# Patient Record
Sex: Male | Born: 1960 | Race: White | Hispanic: No | Marital: Single | Smoking: Current every day smoker
Health system: Southern US, Community
[De-identification: ages and names within clinical notes are randomized; demographics above are authoritative.]

---

## 2020-12-31 ENCOUNTER — Observation Stay: Payer: Self-pay

## 2020-12-31 ENCOUNTER — Other Ambulatory Visit: Payer: Self-pay

## 2020-12-31 ENCOUNTER — Observation Stay
Admit: 2020-12-31 | Discharge: 2020-12-31 | Disposition: A | Discharge status: Home / Self Care | Payer: Self-pay | Attending: Internal Medicine | Admitting: Internal Medicine

## 2020-12-31 ENCOUNTER — Observation Stay
Admission: EM | Admit: 2020-12-31 | Discharge: 2021-01-01 | Disposition: A | Discharge status: Home / Self Care | Payer: Self-pay | Attending: Internal Medicine | Admitting: Internal Medicine

## 2020-12-31 ENCOUNTER — Emergency Department: Payer: Self-pay

## 2020-12-31 DIAGNOSIS — F17210 Nicotine dependence, cigarettes, uncomplicated: Secondary | ICD-10-CM | POA: Insufficient documentation

## 2020-12-31 DIAGNOSIS — I10 Essential (primary) hypertension: Secondary | ICD-10-CM | POA: Insufficient documentation

## 2020-12-31 DIAGNOSIS — Z79899 Other long term (current) drug therapy: Secondary | ICD-10-CM | POA: Insufficient documentation

## 2020-12-31 DIAGNOSIS — G459 Transient cerebral ischemic attack, unspecified: Principal | ICD-10-CM | POA: Insufficient documentation

## 2020-12-31 DIAGNOSIS — R0789 Other chest pain: Secondary | ICD-10-CM | POA: Insufficient documentation

## 2020-12-31 DIAGNOSIS — Z8673 Personal history of transient ischemic attack (TIA), and cerebral infarction without residual deficits: Secondary | ICD-10-CM | POA: Insufficient documentation

## 2020-12-31 DIAGNOSIS — Z20822 Contact with and (suspected) exposure to covid-19: Secondary | ICD-10-CM | POA: Insufficient documentation

## 2020-12-31 LAB — COMPREHENSIVE METABOLIC PANEL
ALT: 19 U/L (ref 0–44)
AST: 20 U/L (ref 15–41)
Albumin: 3.9 g/dL (ref 3.5–5.0)
Alkaline Phosphatase: 50 U/L (ref 38–126)
Anion gap: 9 (ref 5–15)
BUN: 18 mg/dL (ref 6–20)
CO2: 25 mmol/L (ref 22–32)
Calcium: 9.1 mg/dL (ref 8.9–10.3)
Chloride: 102 mmol/L (ref 98–111)
Creatinine, Ser: 1.07 mg/dL (ref 0.61–1.24)
GFR, Estimated: >60 mL/min (ref >60)
Glucose, Bld: 109 mg/dL — ABNORMAL HIGH (ref 70–99)
Potassium: 3.9 mmol/L (ref 3.5–5.1)
Sodium: 136 mmol/L (ref 135–145)
Total Bilirubin: 0.4 mg/dL (ref 0.3–1.2)
Total Protein: 7.3 g/dL (ref 6.5–8.1)

## 2020-12-31 LAB — CBC
HCT: 43.4 % (ref 39.0–52.0)
Hemoglobin: 14.9 g/dL (ref 13.0–17.0)
MCH: 31.6 pg (ref 26.0–34.0)
MCHC: 34.3 g/dL (ref 30.0–36.0)
MCV: 91.9 fL (ref 80.0–100.0)
Platelets: 310 10*3/uL (ref 150–400)
RBC: 4.72 MIL/uL (ref 4.22–5.81)
RDW: 12.8 % (ref 11.5–15.5)
WBC: 12.2 10*3/uL — ABNORMAL HIGH (ref 4.0–10.5)
nRBC: 0 % (ref 0.0–0.2)

## 2020-12-31 LAB — APTT: aPTT: 25 seconds (ref 24–36)

## 2020-12-31 LAB — DIFFERENTIAL
Abs Immature Granulocytes: 0.05 10*3/uL (ref 0.00–0.07)
Basophils Absolute: 0.1 10*3/uL (ref 0.0–0.1)
Basophils Relative: 1 %
Eosinophils Absolute: 0.5 10*3/uL (ref 0.0–0.5)
Eosinophils Relative: 4 %
Immature Granulocytes: 0 %
Lymphocytes Relative: 22 %
Lymphs Abs: 2.6 10*3/uL (ref 0.7–4.0)
Monocytes Absolute: 0.9 10*3/uL (ref 0.1–1.0)
Monocytes Relative: 8 %
Neutro Abs: 8 10*3/uL — ABNORMAL HIGH (ref 1.7–7.7)
Neutrophils Relative %: 65 %

## 2020-12-31 LAB — RESP PANEL BY RT-PCR (FLU A&B, COVID) ARPGX2
Influenza A by PCR: NEGATIVE
Influenza B by PCR: NEGATIVE
SARS Coronavirus 2 by RT PCR: NEGATIVE

## 2020-12-31 LAB — PROTIME-INR
INR: 1 (ref 0.8–1.2)
Prothrombin Time: 12.8 seconds (ref 11.4–15.2)

## 2020-12-31 LAB — CBG MONITORING, ED: Glucose-Capillary: 109 mg/dL — ABNORMAL HIGH (ref 70–99)

## 2020-12-31 LAB — TROPONIN I (HIGH SENSITIVITY)
Troponin I (High Sensitivity): 6 ng/L (ref <18)
Troponin I (High Sensitivity): 6 ng/L (ref <18)

## 2020-12-31 IMAGING — MR MR MRA NECK WO/W CM
4 of 5 series · 35 of 48 positions shown · IV contrast (Contrast agent)
Comparison: MRI brain same day

CLINICAL DATA: Carotid artery dissection suspected. Left-sided
numbness.

EXAM:
MRA NECK WITHOUT AND WITH CONTRAST
MRA HEAD WITHOUT CONTRAST
TECHNIQUE: Multiplanar and multiecho pulse sequences of the neck were obtained
without and with intravenous contrast. Angiographic images of the
neck were obtained using MRA technique without and with intravenous
contrast; Angiographic images of the Circle of Willis were obtained
using MRA technique without intravenous contrast.
CONTRAST:  7mL GADAVIST GADOBUTROL 1 MMOL/ML IV SOLN

[Series 19: angio_fl3d_cor_pre_ttc=2.0s · coronal · 0.9mm · 0.85mm/px · 9 of 96 slices shown]
[im 1/96]
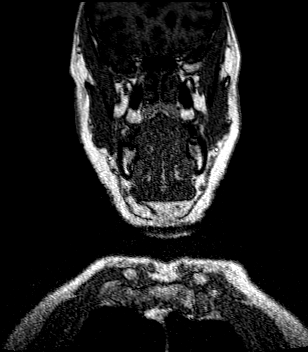
[im 12/96]
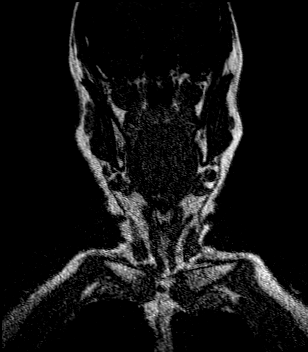
[im 24/96]
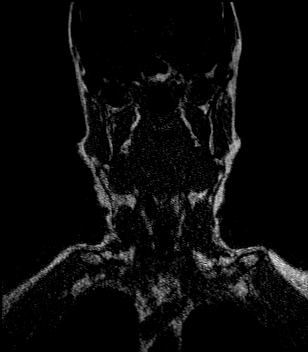
[im 36/96]
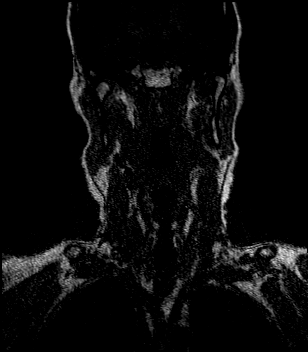
[im 48/96]
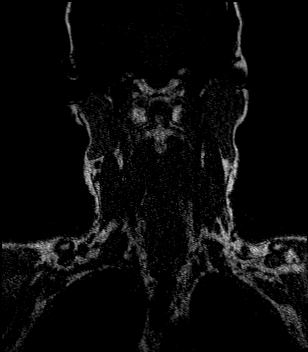
[im 60/96]
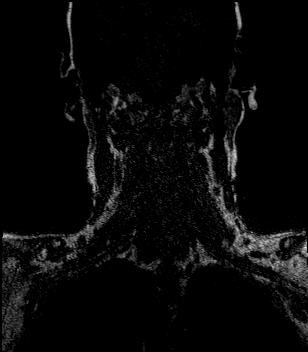
[im 72/96]
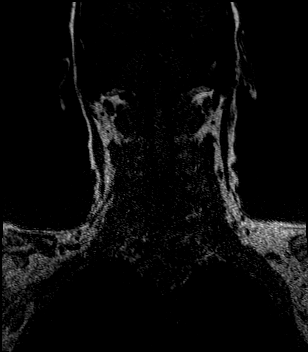
[im 84/96]
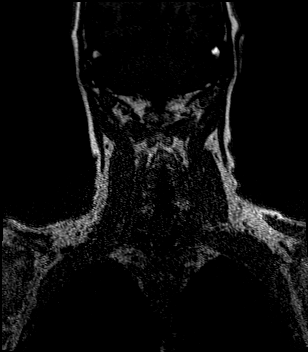
[im 96/96]
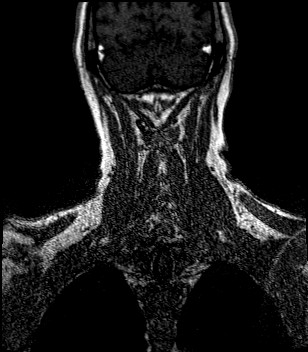

[Series 21: angio_fl3d_cor_post_ttc=2.0s · coronal · 0.9mm · 0.85mm/px · 9 of 90 slices shown]
[im 1/90]
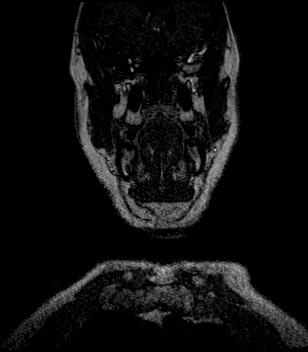
[im 12/90]
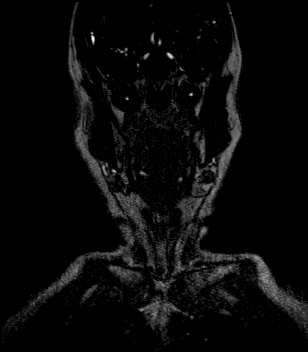
[im 23/90]
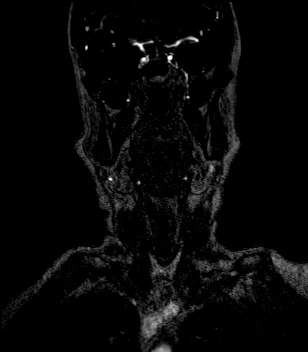
[im 34/90]
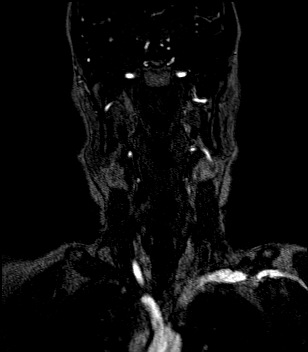
[im 45/90]
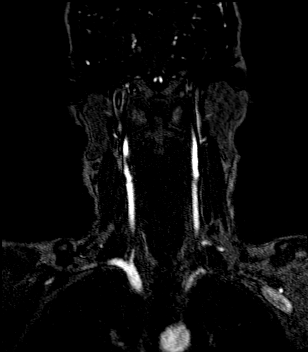
[im 56/90]
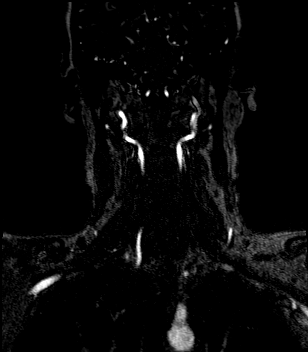
[im 67/90]
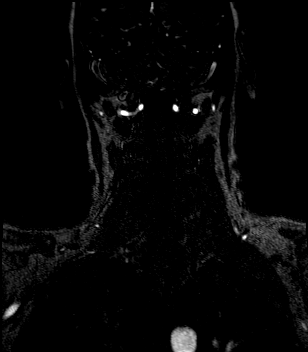
[im 78/90]
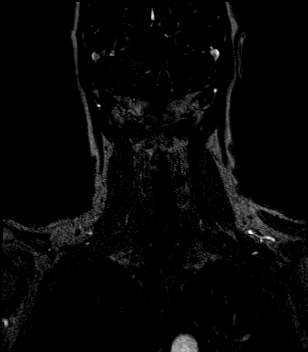
[im 90/90]
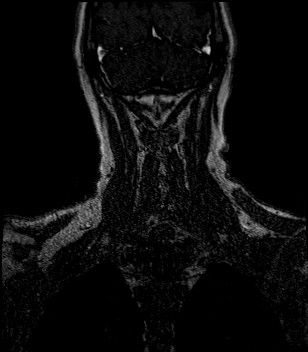

[Series 22: angio_fl3d_cor_post_ttc=2.0s_moco-adv · coronal · 0.9mm · 0.85mm/px · 9 of 91 slices shown]
[im 1/91]
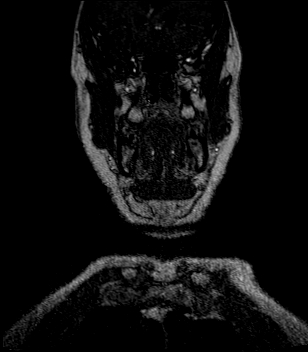
[im 12/91]
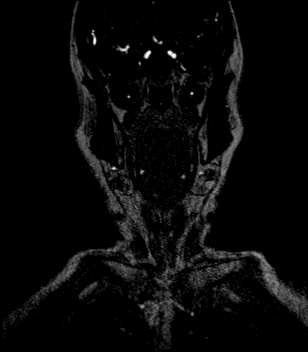
[im 23/91]
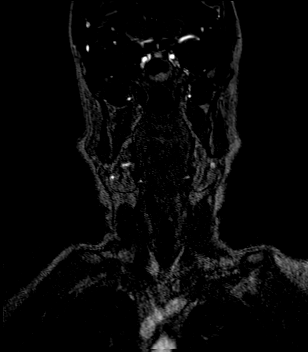
[im 34/91]
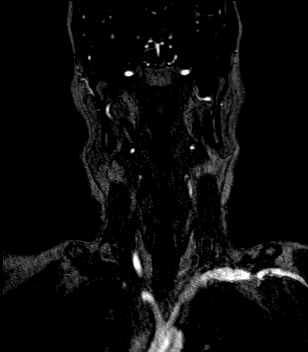
[im 46/91]
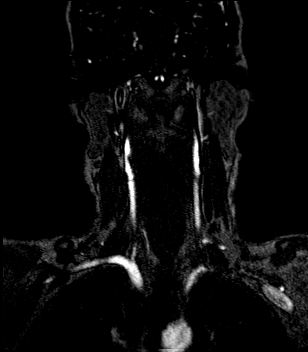
[im 57/91]
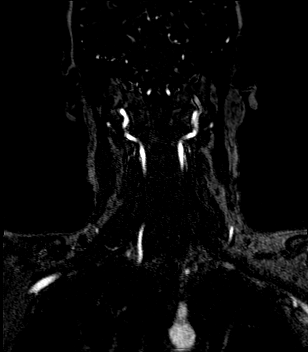
[im 68/91]
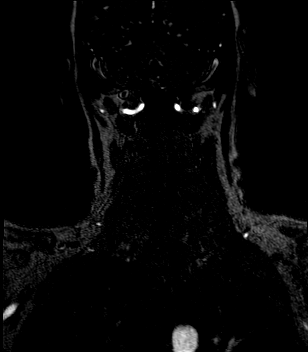
[im 79/91]
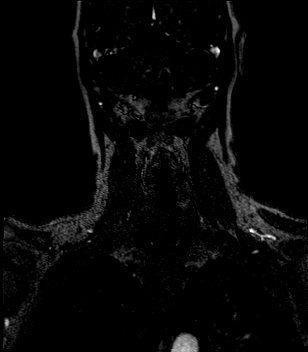
[im 91/91]
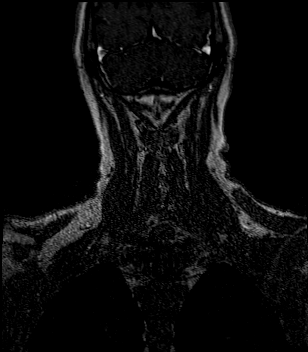

[Series 23: angio_fl3d_cor_post_ttc=2.0s_moco-adv_sub · coronal · 0.9mm · 0.85mm/px · 8 of 84 slices shown]
[im 1/84]
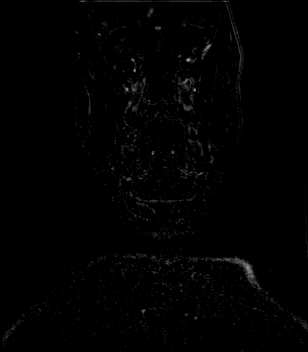
[im 12/84]
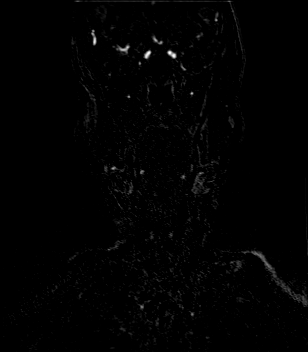
[im 24/84]
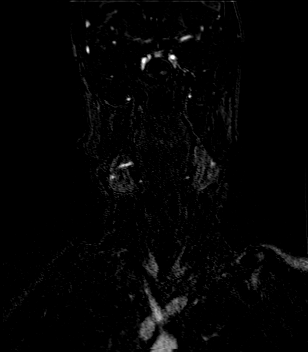
[im 36/84]
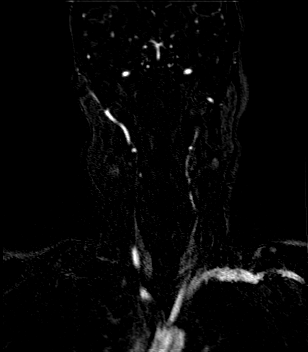
[im 48/84]
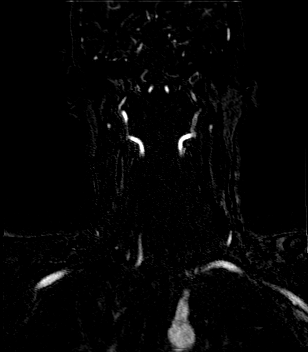
[im 60/84]
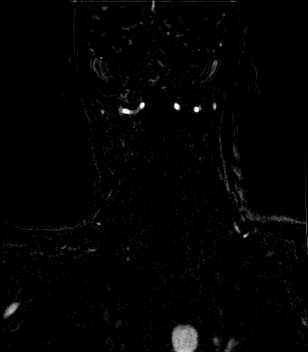
[im 72/84]
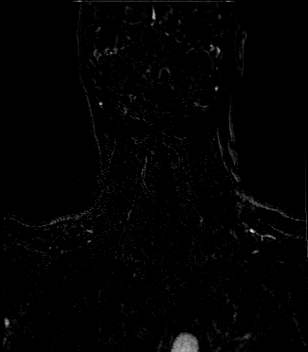
[im 84/84]
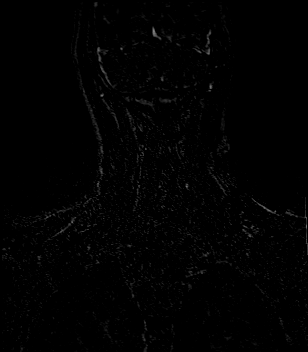

[35 of 48 positions shown; findings below may reference images not displayed]

FINDINGS: MRA NECK FINDINGS

Branching pattern of the brachiocephalic vessels from the arch is
normal. Both common carotid arteries widely patent to the
bifurcation regions. On the left, there is no stenosis or
irregularity. On the right, there is atherosclerotic plaque at the
ICA origin and proximal bulb. Minimal diameter is 2.7 mm. Compared
to a more distal cervical ICA diameter of 4 mm, this indicates a 33%
stenosis.

30% stenosis at both vertebral artery origins. Beyond that, the
vertebral arteries are patent through the cervical region to the
basilar.

MRA HEAD FINDINGS

Both internal carotid arteries widely patent through the skull base
and siphon regions. The anterior and middle cerebral vessels are
patent without proximal stenosis, aneurysm or vascular malformation.
There is artifactual signal loss at the skull base level.

Both vertebral arteries are widely patent to the basilar. There is
artifactual signal loss in the distal vertebral arteries due to
motion. Basilar artery is normal. Posterior circulation branch
vessels appear normal.
IMPRESSION: No intracranial large or medium vessel occlusion.

No evidence of vessel dissection in the neck.

30% stenosis of both vertebral artery origins with wide patency
beyond that.

Atherosclerotic disease at the carotid bifurcation and ICA bulb on
the right with stenosis of 33%.

## 2020-12-31 IMAGING — MR MR MRA HEAD W/O CM
1 series · 19 of 48 positions shown · IV contrast (gadavist)
Comparison: MRI brain same day

CLINICAL DATA: Carotid artery dissection suspected. Left-sided
numbness.

EXAM:
MRA NECK WITHOUT AND WITH CONTRAST
MRA HEAD WITHOUT CONTRAST
TECHNIQUE: Multiplanar and multiecho pulse sequences of the neck were obtained
without and with intravenous contrast. Angiographic images of the
neck were obtained using MRA technique without and with intravenous
contrast; Angiographic images of the Circle of Willis were obtained
using MRA technique without intravenous contrast.
CONTRAST:  7mL GADAVIST GADOBUTROL 1 MMOL/ML IV SOLN

[Series 5: TOF · axial · 0.5mm · 0.41mm/px · z∈[-92,+2]mm · 19 of 205 slices shown]
[im 1/205]
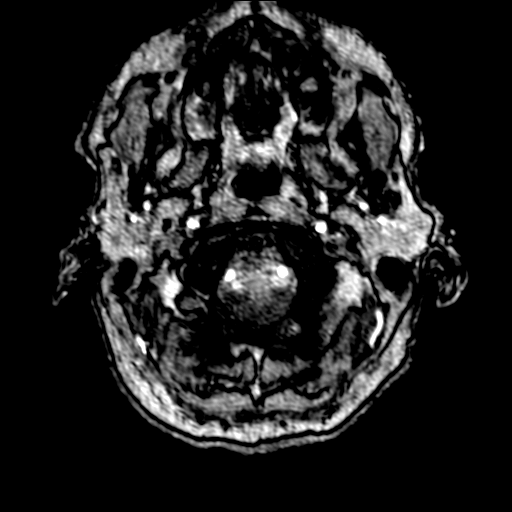
[im 5/205]
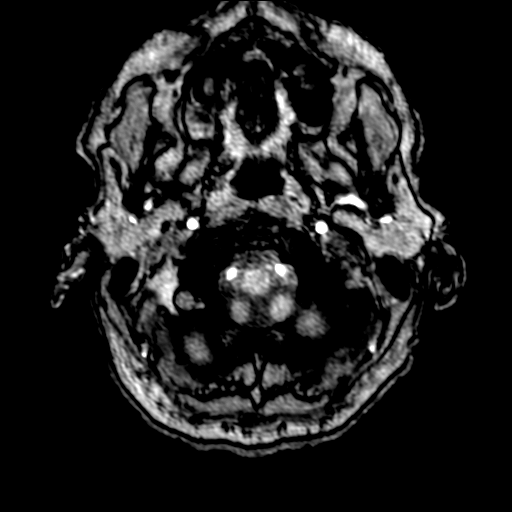
[im 9/205]
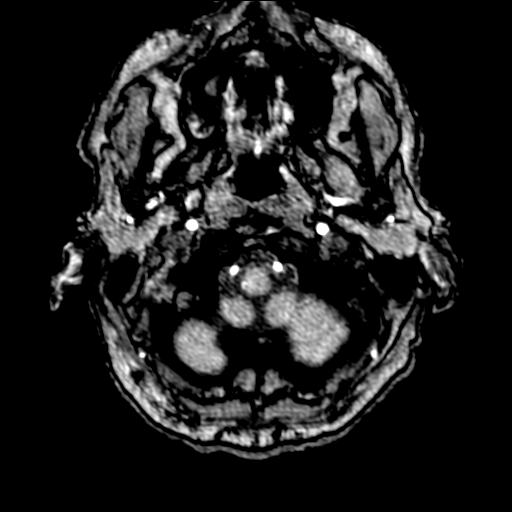
[im 14/205]
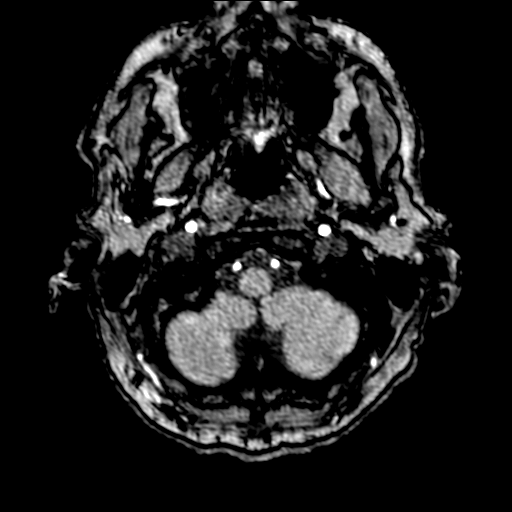
[im 18/205]
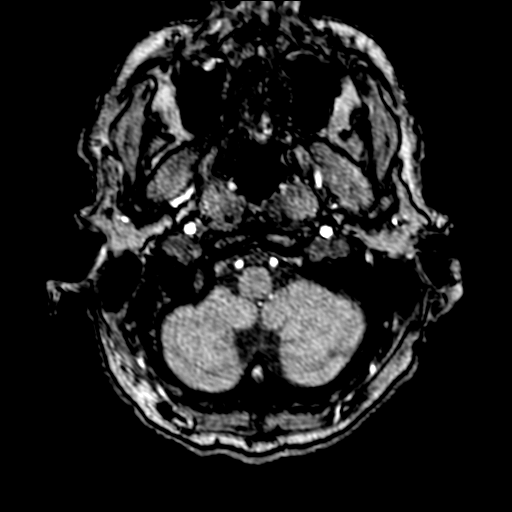
[im 22/205]
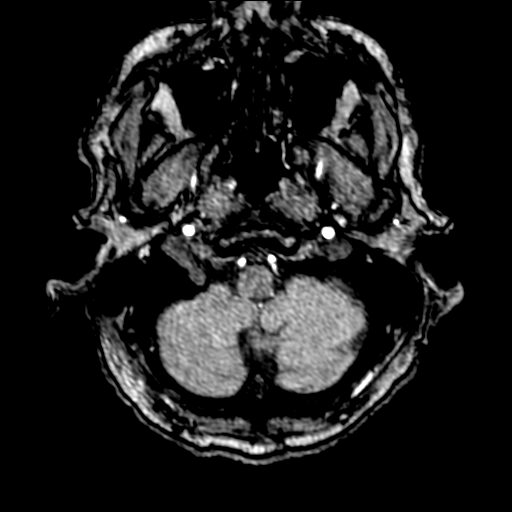
[im 27/205]
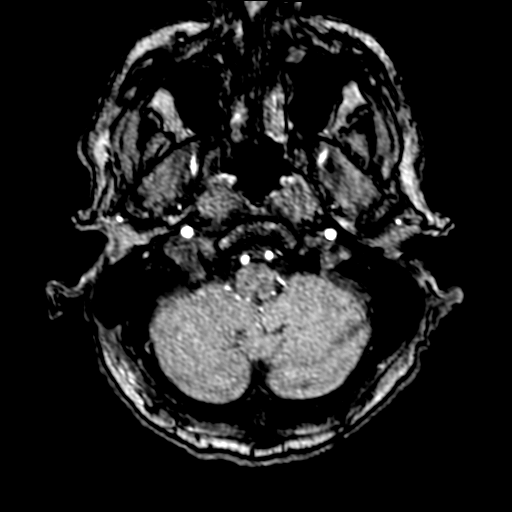
[im 31/205]
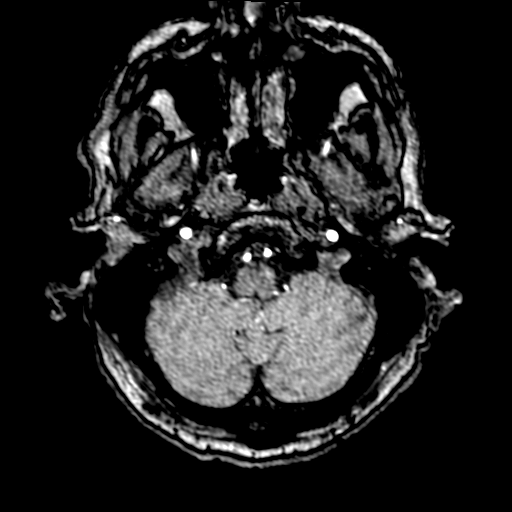
[im 35/205]
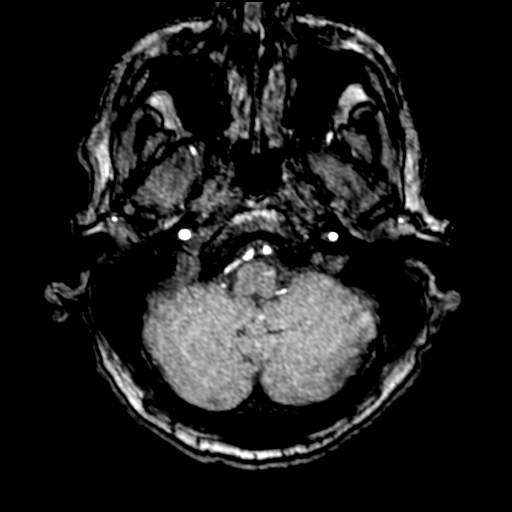
[im 40/205]
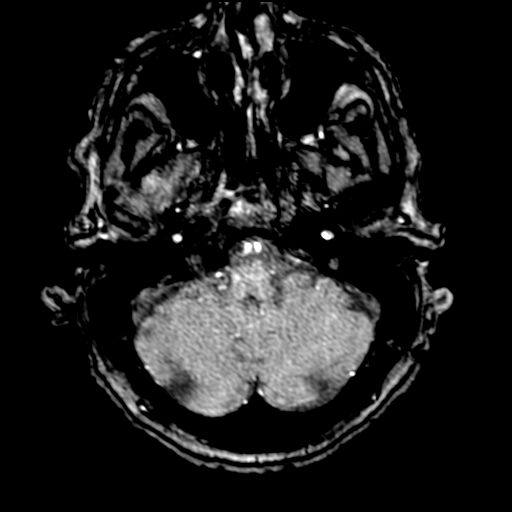
[im 44/205]
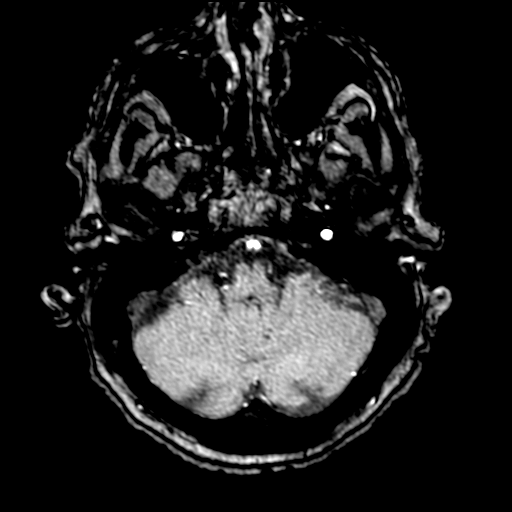
[im 66/205]
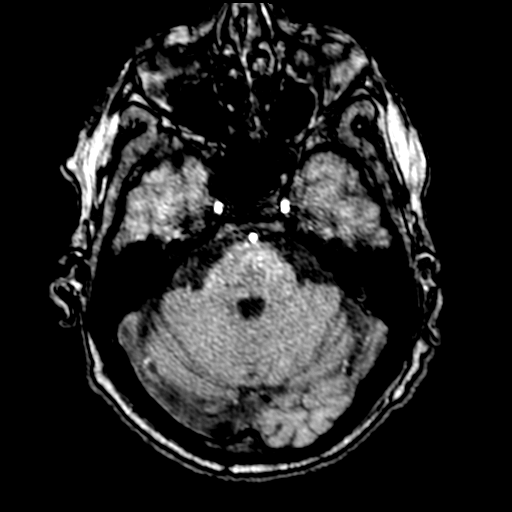
[im 92/205]
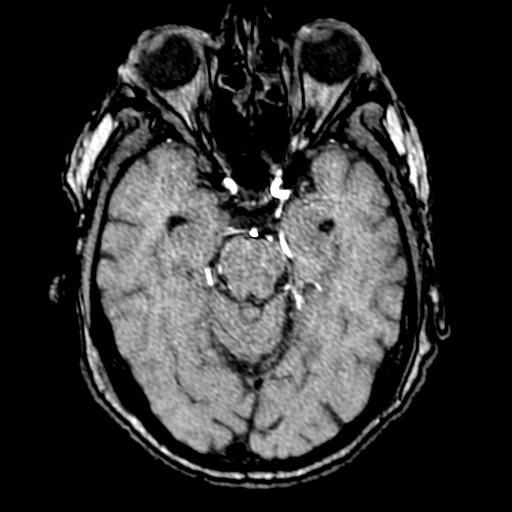
[im 105/205]
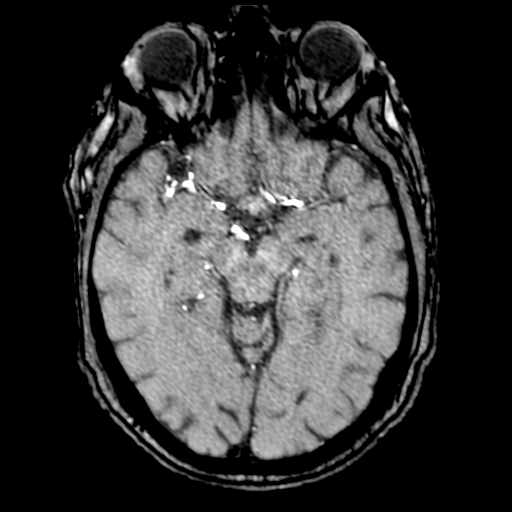
[im 118/205]
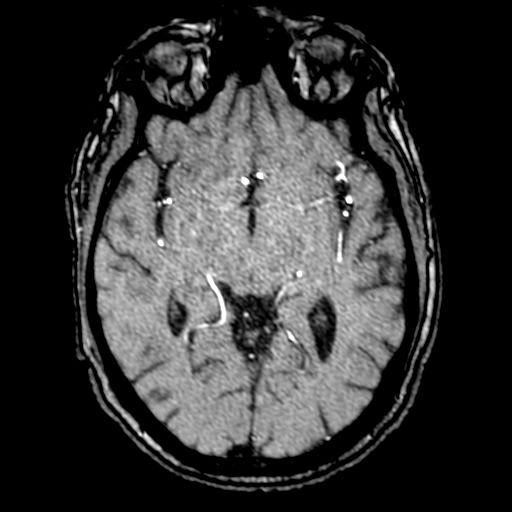
[im 144/205]
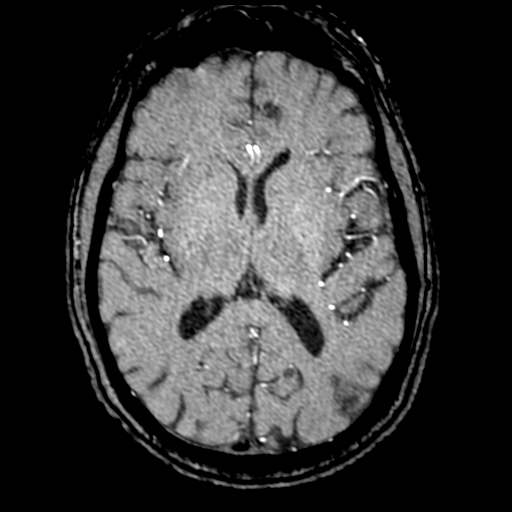
[im 170/205]
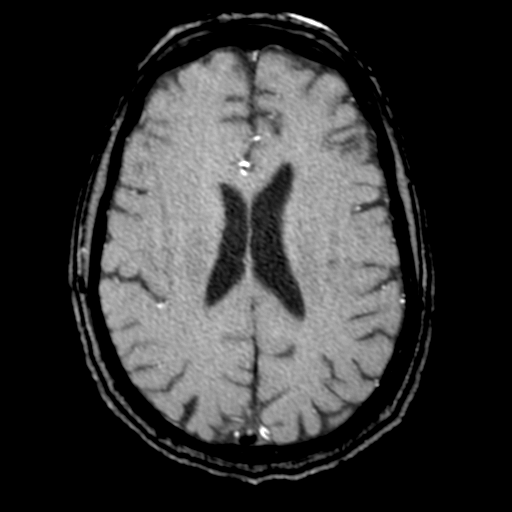
[im 174/205]
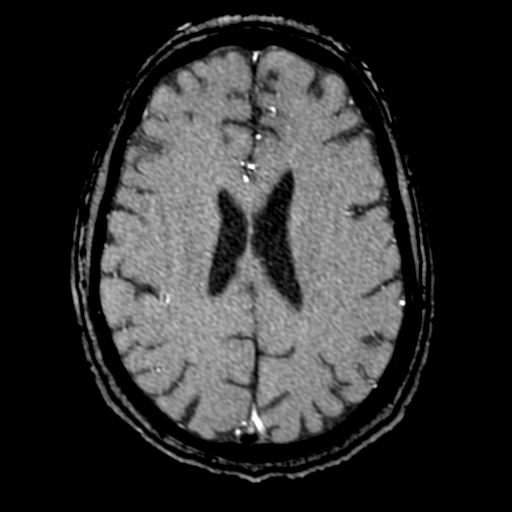
[im 196/205]
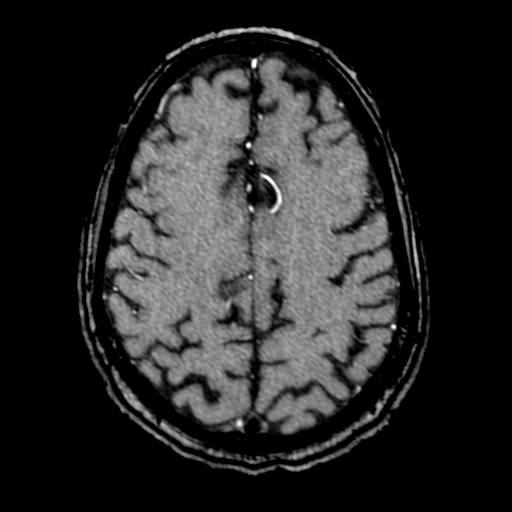

[19 of 48 positions shown; findings below may reference images not displayed]

FINDINGS: MRA NECK FINDINGS

Branching pattern of the brachiocephalic vessels from the arch is
normal. Both common carotid arteries widely patent to the
bifurcation regions. On the left, there is no stenosis or
irregularity. On the right, there is atherosclerotic plaque at the
ICA origin and proximal bulb. Minimal diameter is 2.7 mm. Compared
to a more distal cervical ICA diameter of 4 mm, this indicates a 33%
stenosis.

30% stenosis at both vertebral artery origins. Beyond that, the
vertebral arteries are patent through the cervical region to the
basilar.

MRA HEAD FINDINGS

Both internal carotid arteries widely patent through the skull base
and siphon regions. The anterior and middle cerebral vessels are
patent without proximal stenosis, aneurysm or vascular malformation.
There is artifactual signal loss at the skull base level.

Both vertebral arteries are widely patent to the basilar. There is
artifactual signal loss in the distal vertebral arteries due to
motion. Basilar artery is normal. Posterior circulation branch
vessels appear normal.
IMPRESSION: No intracranial large or medium vessel occlusion.

No evidence of vessel dissection in the neck.

30% stenosis of both vertebral artery origins with wide patency
beyond that.

Atherosclerotic disease at the carotid bifurcation and ICA bulb on
the right with stenosis of 33%.

## 2020-12-31 IMAGING — MR MR HEAD W/O CM
11 series · 48 of 48 positions shown · non-contrast
Comparison: Head CT earlier same day.

CLINICAL DATA: TIA.  Left-sided numbness.

EXAM:
MRI HEAD WITHOUT CONTRAST
TECHNIQUE: Multiplanar, multiecho pulse sequences of the brain and surrounding
structures were obtained without intravenous contrast.

[Series 5: ax dwi_tracew · axial · 3.0mm · 0.65mm/px · z∈[-98,+54]mm · 4 of 48 slices shown]
[im 1/48]
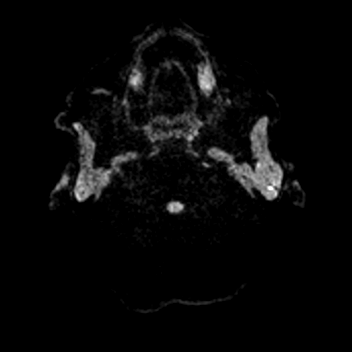
[im 16/48]
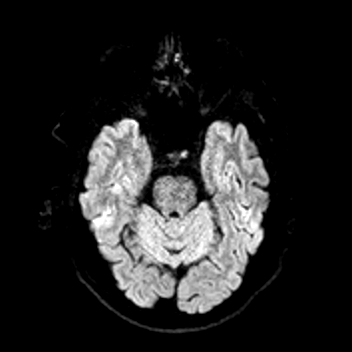
[im 32/48]
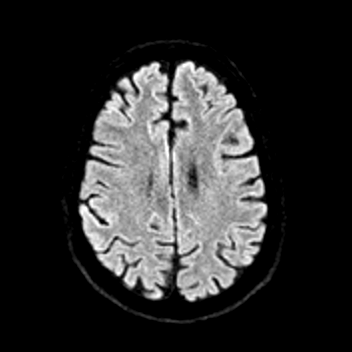
[im 48/48]
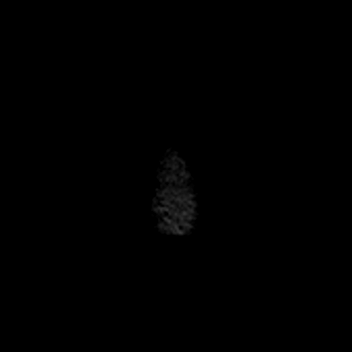

[Series 6: ax dwi_adc · axial · 3.0mm · 0.65mm/px · z∈[-98,+54]mm · 4 of 48 slices shown]
[im 1/48]
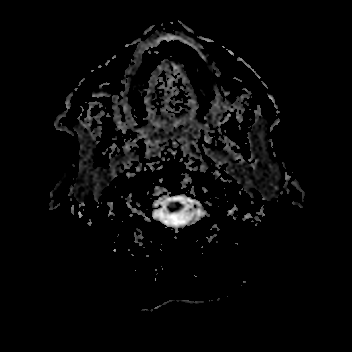
[im 16/48]
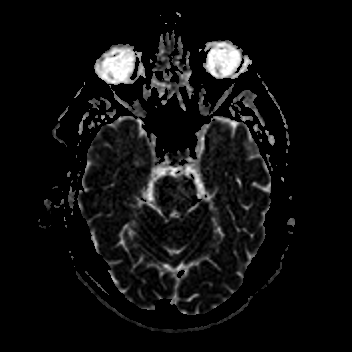
[im 32/48]
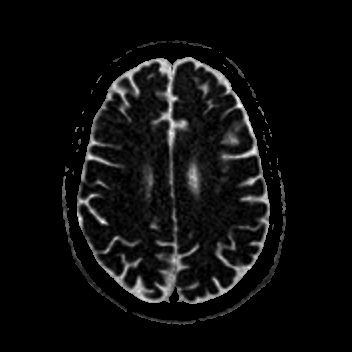
[im 48/48]
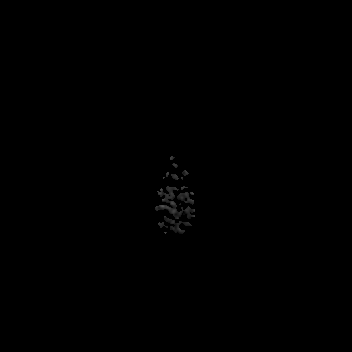

[Series 7: cor dwi_tracew · coronal · 5.0mm · 0.65mm/px · 3 of 40 slices shown]
[im 1/40]
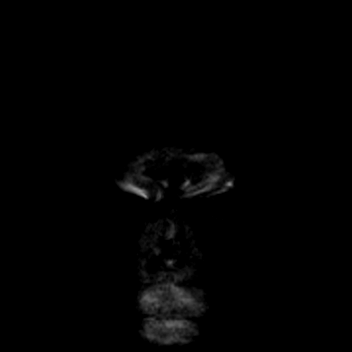
[im 20/40]
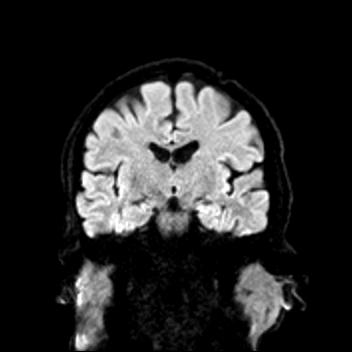
[im 40/40]
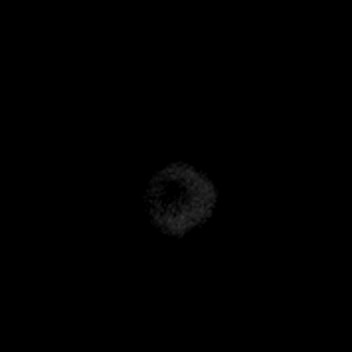

[Series 8: cor dwi_adc · coronal · 5.0mm · 0.65mm/px · 3 of 39 slices shown]
[im 1/39]
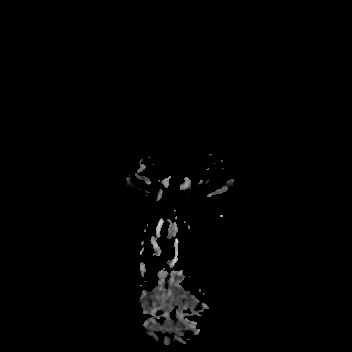
[im 20/39]
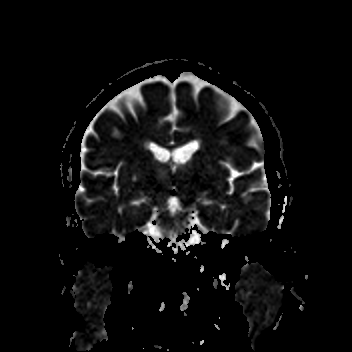
[im 39/39]
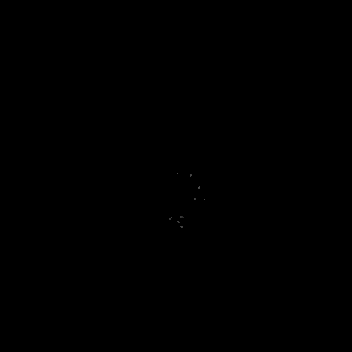

[Series 9: T1 · sagittal · 5.0mm · 0.62mm/px · 2 of 25 slices shown (1 of 2)]
[im 1/25]
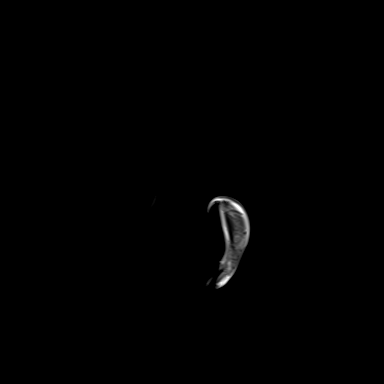
[im 25/25]
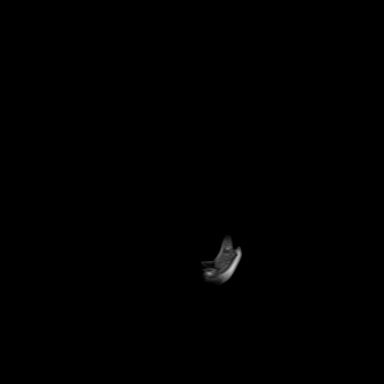

[Series 10: T2 · axial · 5.0mm · 0.53mm/px · z∈[-95,+53]mm · 2 of 26 slices shown (1 of 2)]
[im 1/26]
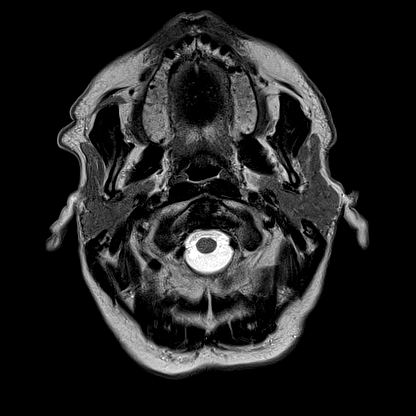
[im 26/26]
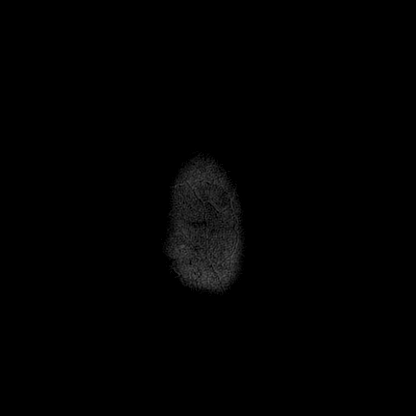

[Series 12: pha_images · axial · 3.0mm · 0.90mm/px · z∈[-108,+66]mm · 5 of 58 slices shown]
[im 1/58]
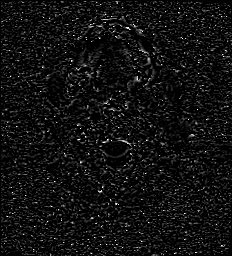
[im 15/58]
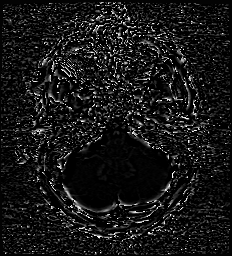
[im 29/58]
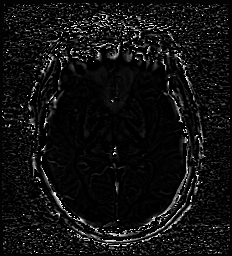
[im 43/58]
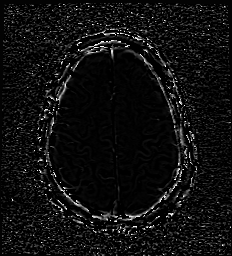
[im 58/58]
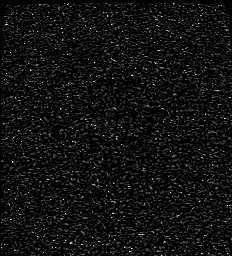

[Series 13: swi_images · axial · 3.0mm · 0.90mm/px · z∈[-108,+66]mm · 5 of 60 slices shown]
[im 1/60]
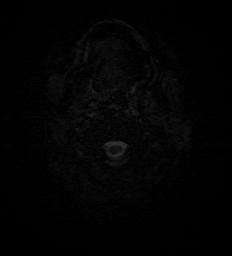
[im 15/60]
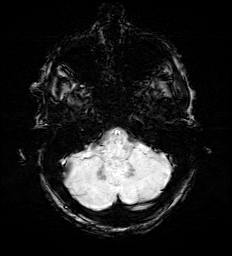
[im 30/60]
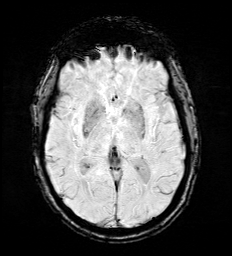
[im 45/60]
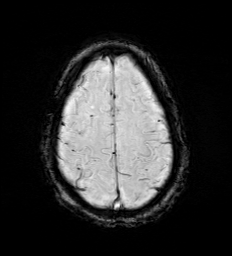
[im 60/60]
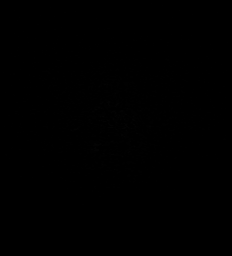

[Series 15: FLAIR · axial · 3.0mm · 0.53mm/px · z∈[-101,+59]mm · 4 of 55 slices shown]
[im 1/55]
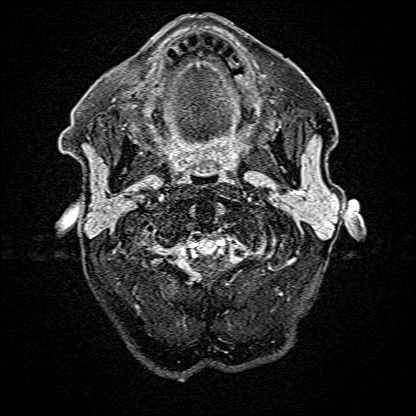
[im 19/55]
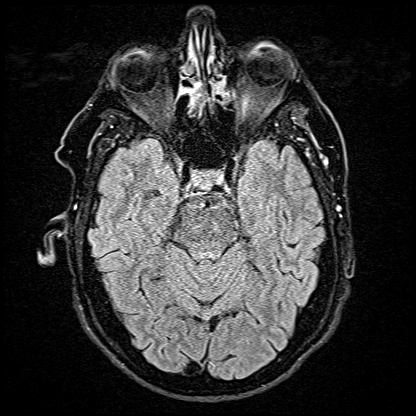
[im 37/55]
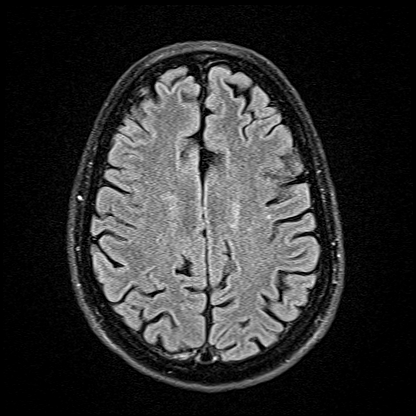
[im 55/55]
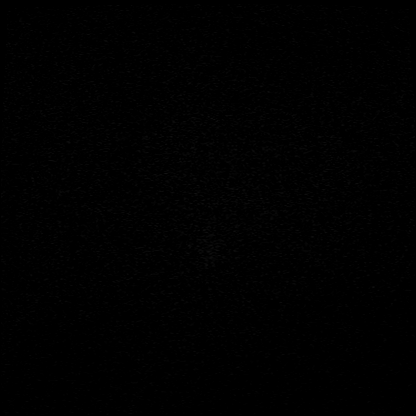

[Series 16: T1 · axial · 1.0mm · 0.98mm/px · z∈[-109,+63]mm · 14 of 176 slices shown (2 of 2)]
[im 1/176]
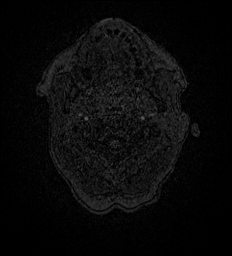
[im 14/176]
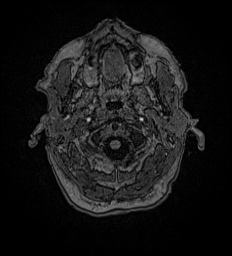
[im 27/176]
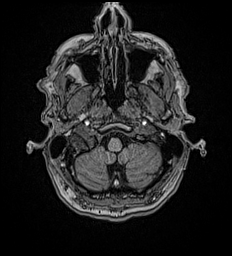
[im 41/176]
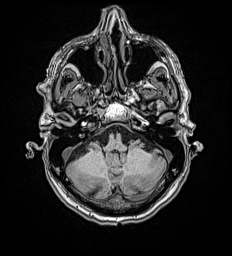
[im 54/176]
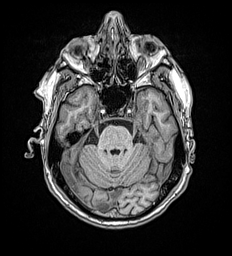
[im 68/176]
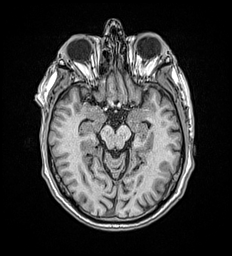
[im 81/176]
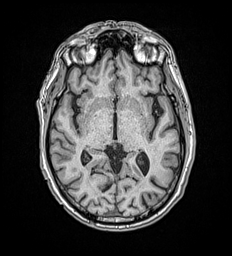
[im 95/176]
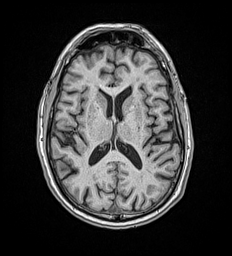
[im 108/176]
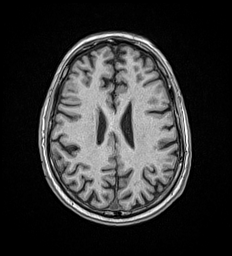
[im 122/176]
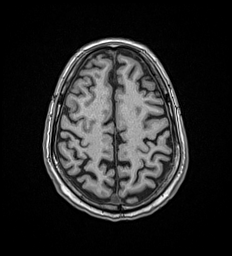
[im 135/176]
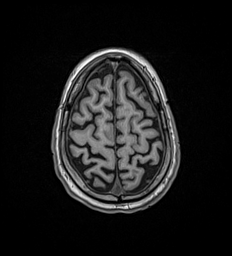
[im 149/176]
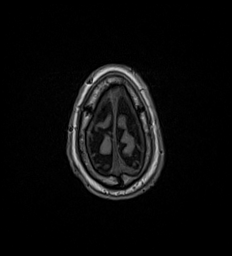
[im 162/176]
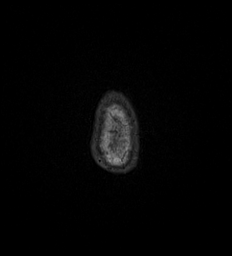
[im 176/176]
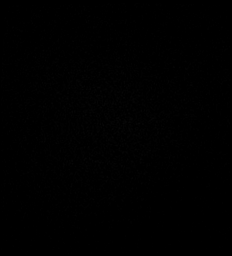

[Series 17: T2 · coronal · 5.0mm · 0.57mm/px · 2 of 29 slices shown (2 of 2)]
[im 1/29]
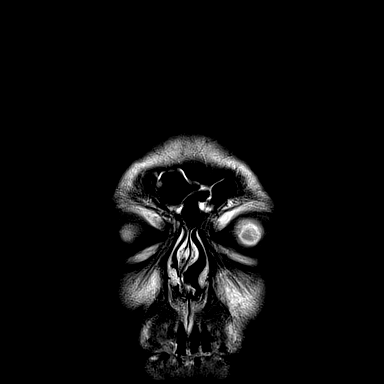
[im 29/29]
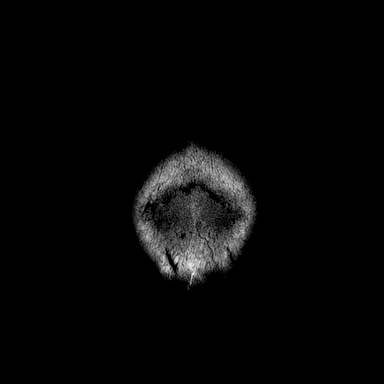

[48 of 48 positions shown; findings below may reference images not displayed]

FINDINGS: Brain: Diffusion imaging does not show any acute or subacute
infarction. Mild chronic small-vessel change affects the pons. No
focal cerebellar finding. Cerebral hemispheres show minimal small
vessel change of the white matter. No cortical or large vessel
territory infarction. No mass lesion, hemorrhage, hydrocephalus or
extra-axial collection.

Vascular: Major vessels at the base of the brain show flow.

Skull and upper cervical spine: Negative

Sinuses/Orbits: Clear/normal

Other: None
IMPRESSION: No acute finding. Minimal chronic appearing small vessel change of
the pons and cerebral hemispheric white matter.

## 2020-12-31 IMAGING — CT CT HEAD CODE STROKE
3 series · 15 of 47 positions shown, 18 images · non-contrast
Comparison: None.

CLINICAL DATA: Code stroke. Acute neuro deficit. Left-sided
numbness.

EXAM:
CT HEAD WITHOUT CONTRAST
TECHNIQUE: Contiguous axial images were obtained from the base of the skull
through the vertex without intravenous contrast.

[Series 4: head wo · axial · 0.44mm/px · z∈[-129,+1]mm · 9 of 32 slices shown, 12 images]
[im 3/32  brain]
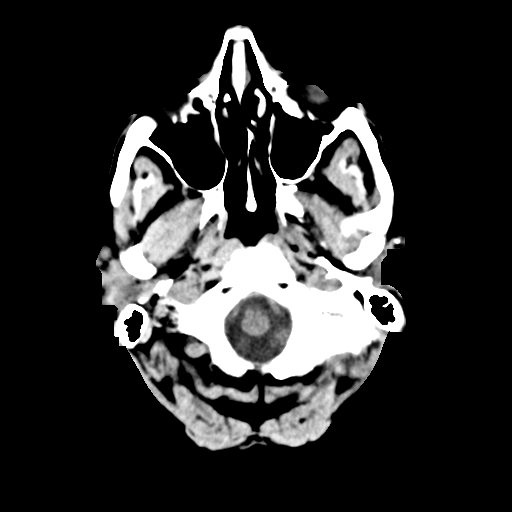
[im 3/32  bone]
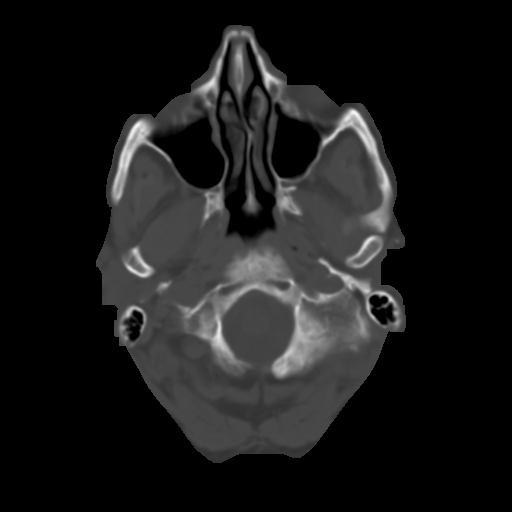
[im 6/32  brain]
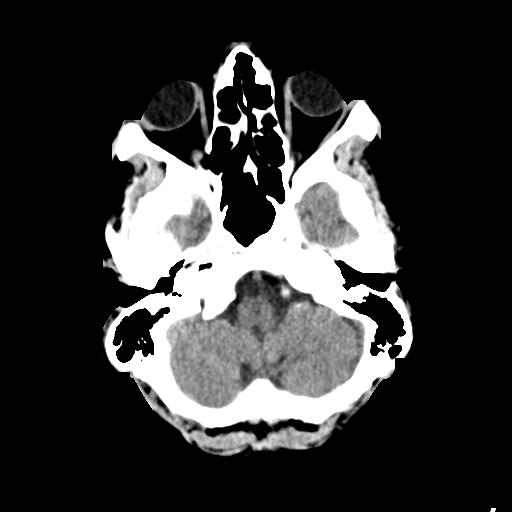
[im 9/32  brain]
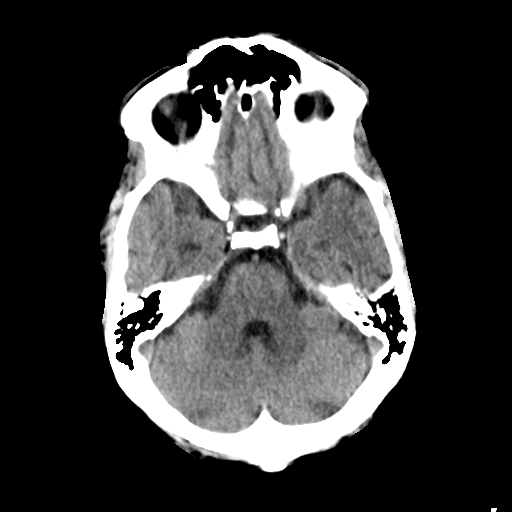
[im 12/32  brain]
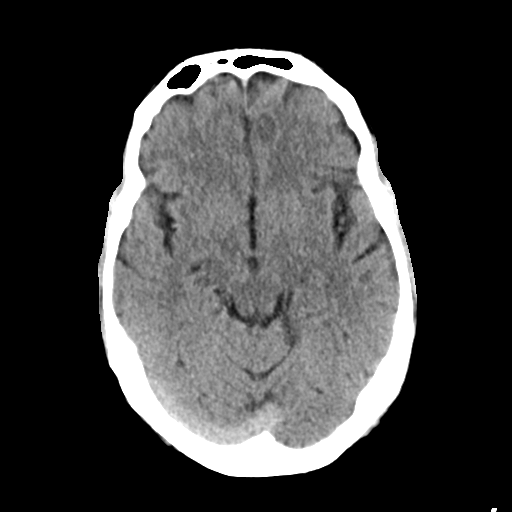
[im 17/32  brain]
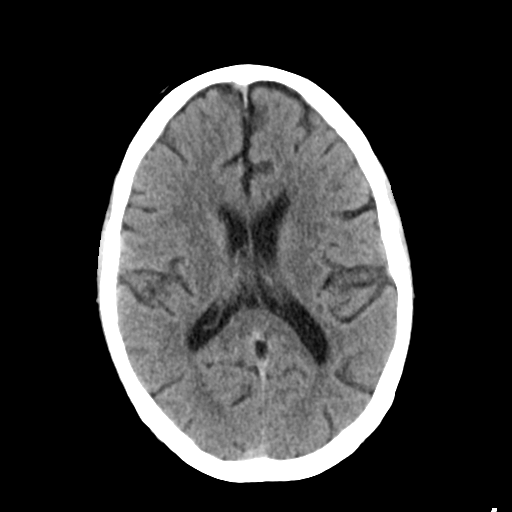
[im 17/32  bone]
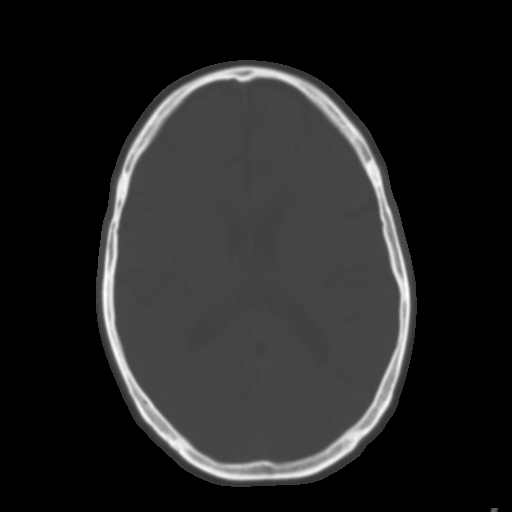
[im 20/32  brain]
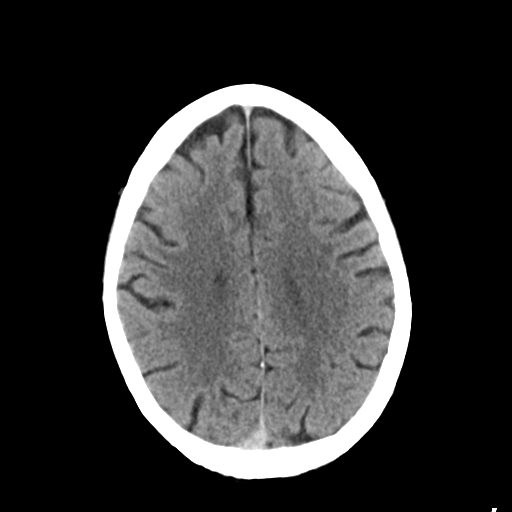
[im 23/32  brain]
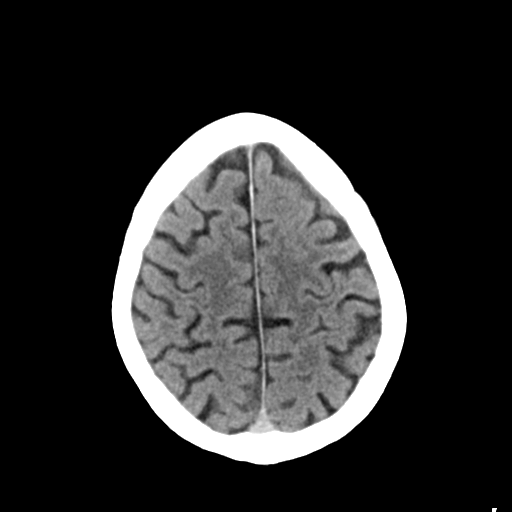
[im 26/32  brain]
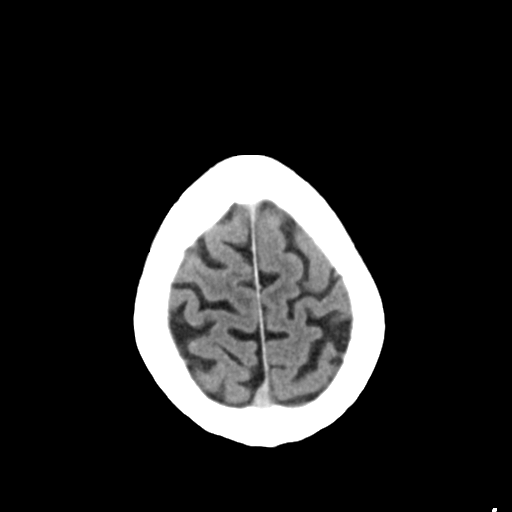
[im 29/32  brain]
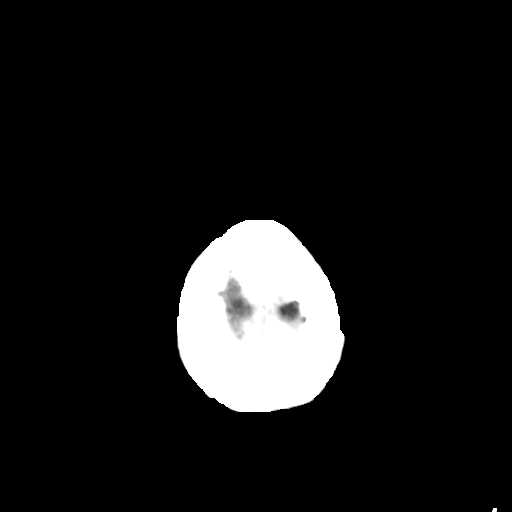
[im 29/32  bone]
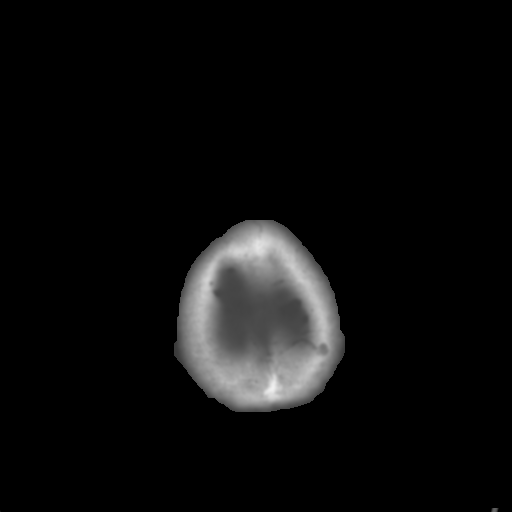

[Series 5: coronal soft tissue · coronal · 0.34mm/px · 3 of 69 slices shown]
[im 23/69  brain]
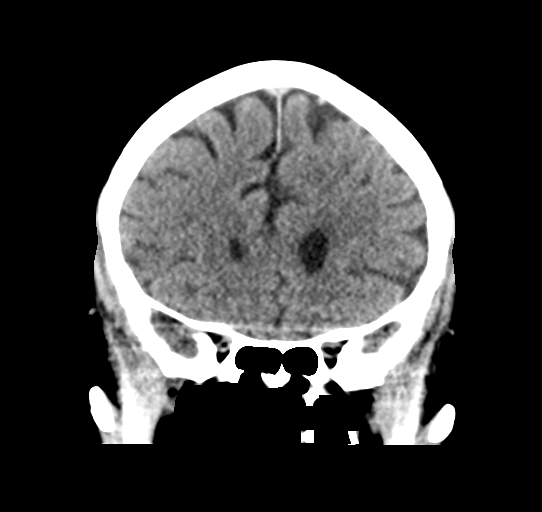
[im 31/69  brain]
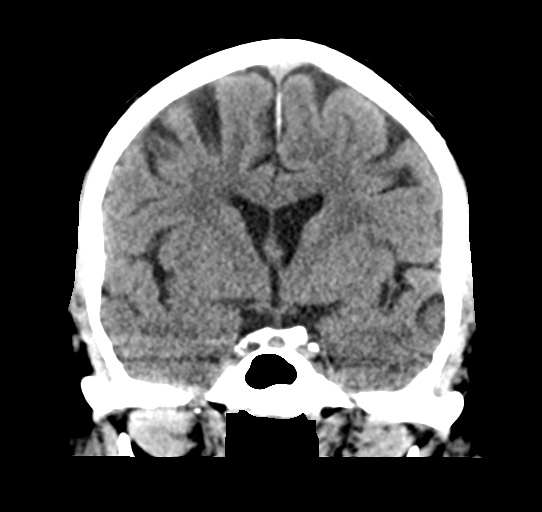
[im 38/69  brain]
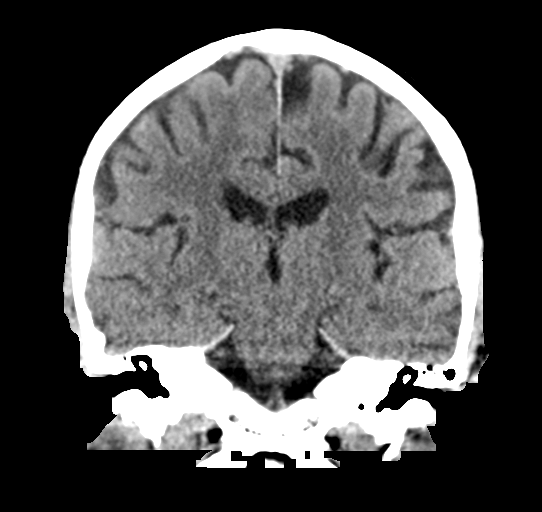

[Series 6: sagittal soft tissue · sagittal · 0.34mm/px · 3 of 54 slices shown]
[im 18/54  brain]
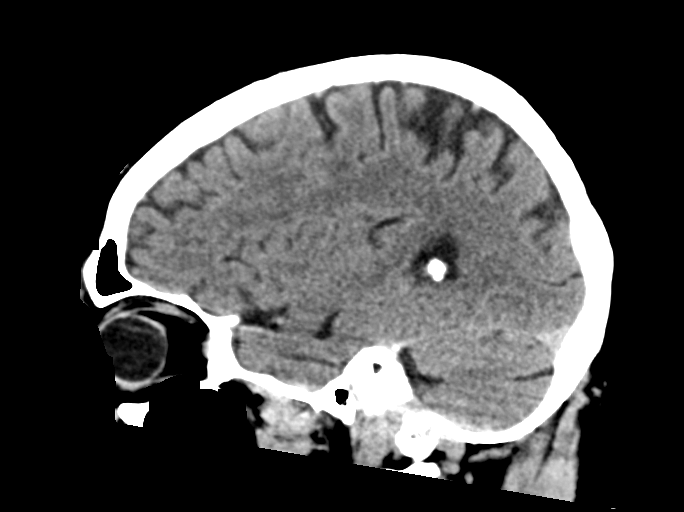
[im 27/54  brain]
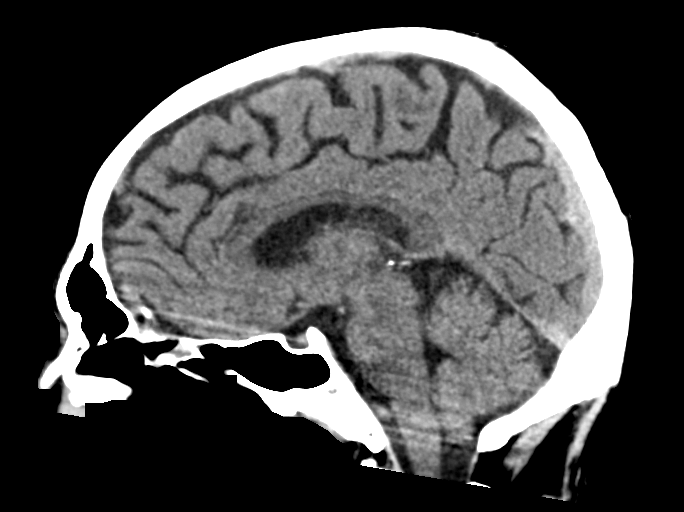
[im 36/54  brain]
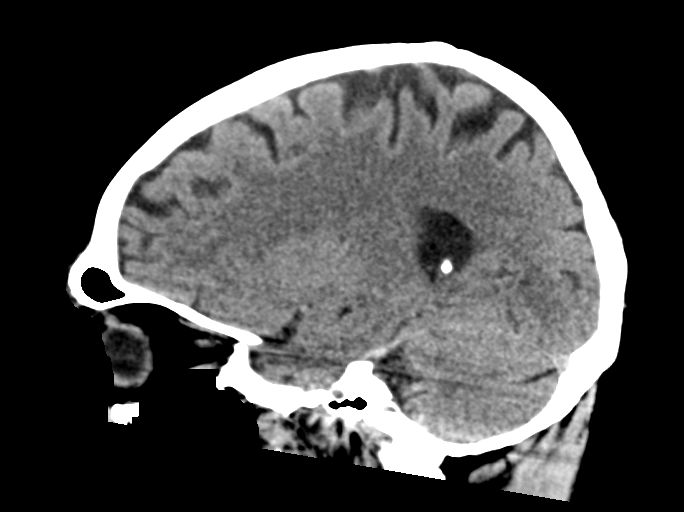

[15 of 47 positions shown; findings below may reference images not displayed]

FINDINGS: Brain: No evidence of acute infarction, hemorrhage, hydrocephalus,
extra-axial collection or mass lesion/mass effect. Small chronic
infarct in the right putamen. Mild white matter hypodensity
bilaterally.

Vascular: Negative for hyperdense vessel

Skull: Negative

Sinuses/Orbits: Mild mucosal edema paranasal sinuses. Negative orbit

Other: None

ASPECTS (Alberta Stroke Program Early CT Score)

- Ganglionic level infarction (caudate, lentiform nuclei, internal
capsule, insula, M1-M3 cortex): 7

- Supraganglionic infarction (M4-M6 cortex): 3

Total score (0-10 with 10 being normal): 10
IMPRESSION: 1. No acute abnormality
2. ASPECTS is 10

## 2020-12-31 MED ORDER — ACETAMINOPHEN 160 MG/5ML PO SOLN
650.0000 mg | ORAL | Status: DC | PRN
  Filled 2020-12-31: qty 20.3

## 2020-12-31 MED ORDER — GADOBUTROL 1 MMOL/ML IV SOLN
7.0000 mL | Freq: Once | INTRAVENOUS | Status: AC | PRN
  Administered 2020-12-31: 7 mL via INTRAVENOUS

## 2020-12-31 MED ORDER — OXYCODONE HCL 5 MG PO TABS: 5.0000 mg | ORAL_TABLET | ORAL | Status: DC | PRN

## 2020-12-31 MED ORDER — ACETAMINOPHEN 325 MG PO TABS: 650.0000 mg | ORAL_TABLET | ORAL | Status: DC | PRN

## 2020-12-31 MED ORDER — TRAZODONE HCL 50 MG PO TABS
100.0000 mg | ORAL_TABLET | Freq: Every day | ORAL | Status: DC
  Administered 2020-12-31: 100 mg via ORAL
  Filled 2020-12-31: qty 2

## 2020-12-31 MED ORDER — ONDANSETRON HCL 4 MG/2ML IJ SOLN: 4.0000 mg | Freq: Four times a day (QID) | INTRAMUSCULAR | Status: DC | PRN

## 2020-12-31 MED ORDER — NICOTINE 21 MG/24HR TD PT24: 21.0000 mg | MEDICATED_PATCH | Freq: Every day | TRANSDERMAL | Status: DC

## 2020-12-31 MED ORDER — ENOXAPARIN SODIUM 40 MG/0.4ML IJ SOSY
40.0000 mg | PREFILLED_SYRINGE | INTRAMUSCULAR | Status: DC
  Administered 2020-12-31: 40 mg via SUBCUTANEOUS
  Filled 2020-12-31: qty 0.4

## 2020-12-31 MED ORDER — HYDRALAZINE HCL 20 MG/ML IJ SOLN: 10.0000 mg | INTRAMUSCULAR | Status: DC | PRN

## 2020-12-31 MED ORDER — SODIUM CHLORIDE 0.9% FLUSH
3.0000 mL | Freq: Once | INTRAVENOUS | Status: AC
  Administered 2020-12-31: 3 mL via INTRAVENOUS

## 2020-12-31 MED ORDER — STROKE: EARLY STAGES OF RECOVERY BOOK: Freq: Once | Status: AC

## 2020-12-31 MED ORDER — ASPIRIN 81 MG PO CHEW
324.0000 mg | CHEWABLE_TABLET | Freq: Once | ORAL | Status: AC
  Administered 2020-12-31: 324 mg via ORAL
  Filled 2020-12-31: qty 4

## 2020-12-31 MED ORDER — AMLODIPINE BESYLATE 5 MG PO TABS
5.0000 mg | ORAL_TABLET | Freq: Every day | ORAL | Status: DC
  Administered 2020-12-31: 5 mg via ORAL
  Filled 2020-12-31: qty 1

## 2020-12-31 MED ORDER — ASPIRIN 300 MG RE SUPP: 300.0000 mg | Freq: Every day | RECTAL | Status: DC

## 2020-12-31 MED ORDER — ATORVASTATIN CALCIUM 20 MG PO TABS
80.0000 mg | ORAL_TABLET | Freq: Every day | ORAL | Status: DC
  Administered 2020-12-31: 80 mg via ORAL
  Filled 2020-12-31 (×2): qty 4

## 2020-12-31 MED ORDER — ACETAMINOPHEN 650 MG RE SUPP: 650.0000 mg | RECTAL | Status: DC | PRN

## 2020-12-31 MED ORDER — LISINOPRIL 10 MG PO TABS
10.0000 mg | ORAL_TABLET | Freq: Every day | ORAL | Status: DC
  Administered 2020-12-31: 10 mg via ORAL
  Filled 2020-12-31: qty 1

## 2020-12-31 MED ORDER — SENNOSIDES-DOCUSATE SODIUM 8.6-50 MG PO TABS: 1.0000 | ORAL_TABLET | Freq: Every evening | ORAL | Status: DC | PRN

## 2020-12-31 MED ORDER — ASPIRIN 325 MG PO TABS
325.0000 mg | ORAL_TABLET | Freq: Every day | ORAL | Status: DC
  Administered 2020-12-31: 325 mg via ORAL
  Filled 2020-12-31 (×3): qty 1

## 2020-12-31 NOTE — ED Triage Notes (Signed)
Pt c/o left sided numbness with chest pain that started PTA

## 2020-12-31 NOTE — Code Documentation (Signed)
Pt arrives via POV with chest pain and left sided weakness and numbness, pt taken to CT and while in CT pt states numbness was improving and chest pain was worsening, Code stroke canceled per Dr. Selina Cooley, report off to [facility] RN

## 2020-12-31 NOTE — Consult Note (Signed)
NEUROLOGY CONSULTATION NOTE   Date of service: Dec 31, 2020 Patient Name: Tyler French MRN:  [id number] DOB:  04/26/1961 Reason for consult: L face and arm numbness with chest pain _ _ _   _ __   _ __ _ _  __ __   _ __   __ _  History of Present Illness   This is a 60 yo man with hx HTN, HL, ischemic infarct L MCA in 2017 who presented with severe sharp chest pain with pain and numbness radiating to left jaw and left arm. Sx started 60 min prior to ED arrival. Stroke code activated on ED arrival. On my examination he had sensory deficit in L face and L arm only. He reported difficulty raising his left leg secondary only to exacerbation of pain in his chest and denied numbness in his LLE. He actually had no drift in any extremity and no facial droop. NIHSS = 1. CT head NAICP. Stroke code was canceled bc sx were favored to be 2/2 ACS and patient had not received EKG or workup for this. Regardless he would not have been a tPA candidate 2/2 low stroke scale and sx favored to be from alternative explanation. CTA not performed bc exam not c/w LVO. Patient's sx including pain were all resolving upon examination by hospitalist and pt was admitted for TIA workup and ACS r/o.   ROS   Per HPI  Past History   History reviewed. No pertinent past medical history. History reviewed. No pertinent surgical history. History reviewed. No pertinent family history. Social History   Socioeconomic History  . Marital status: Single    Spouse name: Not on file  . Number of children: Not on file  . Years of education: Not on file  . Highest education level: Not on file  Occupational History  . Not on file  Tobacco Use  . Smoking status: Current Every Day Smoker    Packs/day: 1.00    Types: Cigarettes  . Smokeless tobacco: Not on file  Substance and Sexual Activity  . Alcohol use: Not on file  . Drug use: Not on file  . Sexual activity: Not on file  Other Topics Concern  . Not on file  Social  History Narrative  . Not on file   Social Determinants of Health   Financial Resource Strain: Not on file  Food Insecurity: Not on file  Transportation Needs: Not on file  Physical Activity: Not on file  Stress: Not on file  Social Connections: Not on file   Not on File  Medications   (Not in a hospital admission)    Vitals   Vitals:   12/31/20 1515 12/31/20 1516 12/31/20 1600 12/31/20 1630  BP: (!) 182/95  (!) 165/97 (!) 154/93  Pulse: 69  71 69  Resp: 12  16 16   Temp: 97.9 F (36.6 C)     TempSrc: Oral     SpO2: 98%  98% 97%  Weight:  72.6 kg    Height:  5\' 9"  (1.753 m)       Body mass index is 23.63 kg/m.  Physical Exam   Physical Exam Gen: A&O x4, visibly uncomfortable, clutching his chest Resp: CTAB, no w/r/r CV: RRR, no m/g/r; nml S1 and S2. 2+ symmetric peripheral pulses.   Neuro: *MS: A&O x4. Follows multi-step commands.  *Speech: fluid, nondysarthric, able to name and repeat *CN:    I: Deferred   II,III: PERRLA, VFF by confrontation, optic discs sharp  III,IV,VI: EOMI w/o nystagmus, no ptosis   V: Sensation intact from V1 to V3 to LT   VII: Eyelid closure was full.  Smile symmetric.   VIII: Hearing intact to voice   IX,X: Voice normal, palate elevates symmetrically    XI: SCM/trap 5/5 bilat   XII: Tongue protrudes midline, no atrophy or fasciculations   *Motor:   Normal bulk.  No tremor, rigidity or bradykinesia. No pronator drift. Strength 5/5 throughout with coaching. Initially had difficulty lifting his LLE which he reported exacerbated his chest pain but was able to do so with coaching without drift. *Sensory: Impaired to LT L face and L arm *Coordination:  FNF intact bilat *Reflexes:  2+ and symmetric throughout without clonus; toes down-going bilat *Gait: deferred  NIHSS = 1 (sensory)   Premorbid mRS = 1  Labs   CBC:  Recent Labs  Lab 12/31/20 1524  WBC 12.2*  NEUTROABS 8.0*  HGB 14.9  HCT 43.4  MCV 91.9  PLT 310     Basic Metabolic Panel:  Lab Results  Component Value Date   NA 136 12/31/2020   K 3.9 12/31/2020   CO2 25 12/31/2020   GLUCOSE 109 (H) 12/31/2020   BUN 18 12/31/2020   CREATININE 1.07 12/31/2020   CALCIUM 9.1 12/31/2020   GFRNONAA >60 12/31/2020   Lipid Panel: No results found for: LDLCALC HgbA1c: No results found for: HGBA1C Urine Drug Screen: No results found for: LABOPIA, COCAINSCRNUR, LABBENZ, AMPHETMU, THCU, LABBARB  Alcohol Level No results found for: [facility]  CT head: NAICP  Impression   This is a 60 yo man with hx HTN, HL, ischemic infarct L MCA in 2017 who presented with severe sharp chest pain with pain and numbness radiating to left jaw and left arm with rapidly resolving sx. Patient admitted for TIA workup and ACS r/o.  Recommendations   - Permissive HTN x48 hrs from sx onset or until stroke ruled out by MRI goal BP <220/110. PRN labetalol or hydralazine if BP above these parameters. Avoid oral antihypertensives. - MRI brain wo contrast - MRA H&N - TTE w/ bubble - Check A1c and LDL + add statin per guidelines - ASA 325mg  now f/b ASA 81mg  daily after that - q4 hr neuro checks - STAT head CT for any change in neuro exam - Tele - PT/OT/SLP - Stroke education - Amb referral to neurology upon discharge   Will continue to follow.  ______________________________________________________________________   Thank you for the opportunity to take part in the care of this patient. If you have any further questions, please contact the neurology consultation attending.  Signed,  , MD [facility] [phone]  If 7pm- 7am, please page neurology on call as listed in AMION.

## 2020-12-31 NOTE — ED Notes (Signed)
Echocardiogram ongoing at bedside.

## 2020-12-31 NOTE — Progress Notes (Signed)
Chaplain Maggie responded to code stroke page to ED. No family was present. Chaplain is available for spiritual and emotional support as needed.

## 2020-12-31 NOTE — ED Notes (Signed)
CNL  CODE  STROKE  CNL  PER  DR  Selina Cooley  MD

## 2020-12-31 NOTE — H&P (Signed)
History and Physical    Tyler French EYC:[id number] DOB: [birth year] DOA: 12/31/2020  PCP: Pcp, No  Patient coming from: Home  I have personally briefly reviewed patient's old medical records in [facility]  Chief Complaint: Chest pain and numbness  HPI: Tyler French is a 60 y.o. male with medical history significant of hypertension, hyperlipidemia, GERD, insomnia, acute ischemic infarct left MCA stroke in 2017 who presents for evaluation of acute onset left-sided numbness with associated with chest pain approximately 1 hour prior to presentation.  The pain was described as sharp and substernal.  At time of arrival to the ED the pain is subsided.  Patient was driving when it began.  Symptoms started with chest pain and developed numbness and tingling to the left side of his face, left arm, left leg.  Also improved upon arrival to the ED but not entirely resolved.  Patient states that symptoms are similar to TIA few years back.  On my evaluation patient is resting comfortably in bed.  He is hemodynamically stable.  He is essentially asymptomatic at time of my examination.  Denies chest pain or shortness of breath.  Denies any persistent numbness tingling.  Denies any dizziness or vertigo.  ED Course: On arrival code stroke was called.  After neurology evaluation was completed as code stroke was canceled.  CT head was negative.  Laboratory investigation overall unrevealing.  EDP contacted hospitalist for admission for stroke work-up.  Review of Systems: As per HPI otherwise 14 point review of systems negative.    History reviewed. No pertinent past medical history.  History reviewed. No pertinent surgical history.   reports that he has been smoking cigarettes. He has been smoking about 1.00 pack per day. He does not have any smokeless tobacco history on file. No history on file for alcohol use and drug use.  Not on File  History reviewed. No pertinent family history. No  reported family history of stroke  Prior to Admission medications   Medication Sig Start Date End Date Taking? Authorizing Provider  amLODipine (NORVASC) 5 MG tablet Take 5 mg by mouth daily. 11/28/20   [provider]  atorvastatin (LIPITOR) 80 MG tablet Take 80 mg by mouth daily. 12/21/20   [provider]  omeprazole (PRILOSEC) 40 MG capsule Take 40 mg by mouth daily. 12/30/20   [provider]  traZODone (DESYREL) 100 MG tablet Take 200 mg by mouth at bedtime. 12/12/20   [provider]    Physical Exam: Vitals:   12/31/20 1515 12/31/20 1516 12/31/20 1600 12/31/20 1630  BP: (!) 182/95  (!) 165/97 (!) 154/93  Pulse: 69  71 69  Resp: 12  16 16   Temp: 97.9 F (36.6 C)     TempSrc: Oral     SpO2: 98%  98% 97%  Weight:  72.6 kg    Height:  5\' 9"  (1.753 m)       Vitals:   12/31/20 1515 12/31/20 1516 12/31/20 1600 12/31/20 1630  BP: (!) 182/95  (!) 165/97 (!) 154/93  Pulse: 69  71 69  Resp: 12  16 16   Temp: 97.9 F (36.6 C)     TempSrc: Oral     SpO2: 98%  98% 97%  Weight:  72.6 kg    Height:  5\' 9"  (1.753 m)    Constitutional: NAD, calm, comfortable Eyes: PERRL, lids and conjunctivae normal ENMT: Mucous membranes are moist. Posterior pharynx clear of any exudate or lesions.Normal dentition.  Neck: normal, supple, no  masses, no thyromegaly Respiratory: clear to auscultation bilaterally, no wheezing, no crackles. Normal respiratory effort. No accessory muscle use.  Cardiovascular: Regular rate and rhythm, no murmurs / rubs / gallops. No extremity edema. 2+ pedal pulses. No carotid bruits.  Abdomen: no tenderness, no masses palpated. No hepatosplenomegaly. Bowel sounds positive.  Musculoskeletal: no clubbing / cyanosis. No joint deformity upper and lower extremities. Good ROM, no contractures. Normal muscle tone.  Skin: no rashes, lesions, ulcers. No induration Neurologic: CN 2-12 grossly intact. Sensation intact, DTR normal. Strength 5/5 in all  4.  Psychiatric: Normal judgment and insight. Alert and oriented x 3. Normal mood.   Labs on Admission: I have personally reviewed following labs and imaging studies  CBC: Recent Labs  Lab 12/31/20 1524  WBC 12.2*  NEUTROABS 8.0*  HGB 14.9  HCT 43.4  MCV 91.9  PLT 310   Basic Metabolic Panel: Recent Labs  Lab 12/31/20 1524  NA 136  K 3.9  CL 102  CO2 25  GLUCOSE 109*  BUN 18  CREATININE 1.07  CALCIUM 9.1   GFR: Estimated Creatinine Clearance: 73.4 mL/min (by C-G formula based on SCr of 1.07 mg/dL). Liver Function Tests: Recent Labs  Lab 12/31/20 1524  AST 20  ALT 19  ALKPHOS 50  BILITOT 0.4  PROT 7.3  ALBUMIN 3.9   No results for input(s): LIPASE, AMYLASE in the last 168 hours. No results for input(s): AMMONIA in the last 168 hours. Coagulation Profile: Recent Labs  Lab 12/31/20 1524  INR 1.0   Cardiac Enzymes: No results for input(s): CKTOTAL, CKMB, CKMBINDEX, TROPONINI in the last 168 hours. BNP (last 3 results) No results for input(s): PROBNP in the last 8760 hours. HbA1C: No results for input(s): HGBA1C in the last 72 hours. CBG: Recent Labs  Lab 12/31/20 1528  GLUCAP 109*   Lipid Profile: No results for input(s): CHOL, HDL, LDLCALC, TRIG, CHOLHDL, LDLDIRECT in the last 72 hours. Thyroid Function Tests: No results for input(s): TSH, T4TOTAL, FREET4, T3FREE, THYROIDAB in the last 72 hours. Anemia Panel: No results for input(s): VITAMINB12, FOLATE, FERRITIN, TIBC, IRON, RETICCTPCT in the last 72 hours. Urine analysis: No results found for: COLORURINE, APPEARANCEUR, LABSPEC, PHURINE, GLUCOSEU, HGBUR, BILIRUBINUR, KETONESUR, PROTEINUR, UROBILINOGEN, NITRITE, LEUKOCYTESUR  Radiological Exams on Admission: CT HEAD CODE STROKE WO CONTRAST  Result Date: 12/31/2020 CLINICAL DATA:  Code stroke. Acute neuro deficit. Left-sided numbness. EXAM: CT HEAD WITHOUT CONTRAST TECHNIQUE: Contiguous axial images were obtained from the base of the skull through  the vertex without intravenous contrast. COMPARISON:  None. FINDINGS: Brain: No evidence of acute infarction, hemorrhage, hydrocephalus, extra-axial collection or mass lesion/mass effect. Small chronic infarct in the right putamen. Mild white matter hypodensity bilaterally. Vascular: Negative for hyperdense vessel Skull: Negative Sinuses/Orbits: Mild mucosal edema paranasal sinuses. Negative orbit Other: None ASPECTS (Alberta Stroke Program Early CT Score) - Ganglionic level infarction (caudate, lentiform nuclei, internal capsule, insula, M1-M3 cortex): 7 - Supraganglionic infarction (M4-M6 cortex): 3 Total score (0-10 with 10 being normal): 10 IMPRESSION: 1. No acute abnormality 2. ASPECTS is 10 Electronically Signed   By: Marlan Palau M.D.   On: 12/31/2020 15:11    EKG: Independently reviewed.  Normal sinus rhythm  Assessment/Plan Active Problems:   TIA (transient ischemic attack)   Left-sided numbness and tingling Suspected TIA Apparently occurred while patient was driving Considering his history of CVA recommend observation for further work-up Plan: Place in observation Telemetry monitoring MRI brain without contrast Check carotid duplex Check echocardiogram Neurology consulted, message sent  to Dr. Selina Cooley Will defer further imaging to neurology recommendations Past RN stroke swallow screen, okay for diet Continue telemetry monitoring  History of CVA Acute ischemic infarct left MCA stroke in 2017 Patient does not appear to be on any antiplatelet agents  Essential hypertension Resume home amlodipine 5 mg daily and lisinopril 20 mg daily As needed hydralazine for marked hypertension  Hyperlipidemia On Lipitor 80 mg daily, will continue  Insomnia Resume home trazodone 100 mg nightly  Tobacco abuse History of alcohol abuse Per last documentation patient has been trying to cut down on alcohol's No clear indication withdrawal at this time Defer CIWA protocol for now but low  threshold to start should patient exhibit any signs of withdrawal Offer nicotine patch    DVT prophylaxis: SQ Lovenox Code Status: Full Family Communication: None.  Offered to call but patient declined.  Plan of care was discussed at length with patient Disposition Plan: Anticipate return to previous home environment.  Anticipated date of discharge 5/25 if work-up reassuring consults called: Neurology Dr. Selina Cooley Admission status: Observation, MedSurg   Tresa Moore MD [facility]   If 7PM-7AM, please contact night-coverage   12/31/2020, 5:05 PM

## 2020-12-31 NOTE — ED Provider Notes (Signed)
[facility] Emergency Department Provider Note ____________________________________________   Event Date/Time   First MD Initiated Contact with Patient 12/31/20 1502     (approximate)  I have reviewed the triage vital signs and the nursing notes.   HISTORY  Chief Complaint Weakness    HPI Jayren Cease is a 60 y.o. male with PMH of stroke, hypertension, hyperlipidemia, alcohol abuse, and GERD who presents with left sided numbness, acute onset approximately 1 hour ago, and preceded by chest pain.  He describes the pain as sharp and substernal.  It has now somewhat subsided.  He states he was driving when it began.  He then developed numbness and tingling in the left side of his face, left arm, and left leg.  This has also improved but not resolved.  He states it feels similar to when he had a TIA a few years ago.  History reviewed. No pertinent past medical history.  There are no problems to display for this patient.   History reviewed. No pertinent surgical history.  Prior to Admission medications   Not on File    Allergies Patient has no allergy information on record.  History reviewed. No pertinent family history.  Social History Social History   Tobacco Use  . Smoking status: Current Every Day Smoker    Packs/day: 1.00    Types: Cigarettes    Review of Systems  Constitutional: No fever. Eyes: No visual changes. ENT: No sore throat. Cardiovascular: Positive for chest pain. Respiratory: Denies shortness of breath. Gastrointestinal: No vomiting or diarrhea.  Genitourinary: Negative for flank pain. Musculoskeletal: Negative for back pain. Skin: Negative for rash. Neurological: Positive for left-sided numbness.   ____________________________________________   PHYSICAL EXAM:  VITAL SIGNS: ED Triage Vitals  Enc Vitals Group     BP 12/31/20 1456 (!) 164/91     Pulse Rate 12/31/20 1456 67     Resp 12/31/20 1456 17      Temp 12/31/20 1456 97.7 F (36.5 C)     Temp Source 12/31/20 1456 Oral     SpO2 12/31/20 1456 98 %     Weight 12/31/20 1516 160 lb (72.6 kg)     Height 12/31/20 1516 5\' 9"  (1.753 m)     Head Circumference --      Peak Flow --      Pain Score 12/31/20 1516 3     Pain Loc --      Pain Edu? --      Excl. in GC? --     Constitutional: Alert and oriented. Well appearing and in no acute distress. Eyes: Conjunctivae are normal.  EOMI.  PERRLA. Head: Atraumatic. Nose: No congestion/rhinnorhea. Mouth/Throat: Mucous membranes are moist.   Neck: Normal range of motion.  Cardiovascular: Normal rate, regular rhythm. Grossly normal heart sounds.  Good peripheral circulation. Respiratory: Normal respiratory effort.  No retractions. Lungs CTAB. Gastrointestinal: No distention.  Musculoskeletal: No lower extremity edema.  Extremities warm and well perfused.  Neurologic:  Normal speech and language.  No facial droop.  Mild left lower extremity drift/weakness.  Motor intact in all other extremities.  No ataxia.  No pronator drift. Skin:  Skin is warm and dry. No rash noted. Psychiatric: Mood and affect are normal. Speech and behavior are normal.  ____________________________________________   LABS (all labs ordered are listed, but only abnormal results are displayed)  Labs Reviewed  CBC - Abnormal; Notable for the following components:      Result Value   WBC 12.2 (*)  All other components within normal limits  DIFFERENTIAL - Abnormal; Notable for the following components:   Neutro Abs 8.0 (*)    All other components within normal limits  COMPREHENSIVE METABOLIC PANEL - Abnormal; Notable for the following components:   Glucose, Bld 109 (*)    All other components within normal limits  CBG MONITORING, ED - Abnormal; Notable for the following components:   Glucose-Capillary 109 (*)    All other components within normal limits  RESP PANEL BY RT-PCR (FLU A&B, COVID) ARPGX2  PROTIME-INR   APTT  I-STAT CREATININE, ED  TROPONIN I (HIGH SENSITIVITY)   ____________________________________________  EKG  ED ECG REPORT I, Dionne Bucy, the attending physician, personally viewed and interpreted this ECG.  Date: 12/31/2020 EKG Time: 1512 Rate: 73 Rhythm: normal sinus rhythm QRS Axis: Borderline right axis Intervals: normal ST/T Wave abnormalities: Minimal anterior ST abnormalities Narrative Interpretation: Nonspecific abnormalities with no evidence of acute ischemia; no prior EKG available for comparison  ____________________________________________  RADIOLOGY  CT head: No ICH or evidence of acute stroke  ____________________________________________   PROCEDURES  Procedure(s) performed: No  Procedures  Critical Care performed: Yes  CRITICAL CARE Performed by: Dionne Bucy   Total critical care time: 20 minutes  Critical care time was exclusive of separately billable procedures and treating other patients.  Critical care was necessary to treat or prevent imminent or life-threatening deterioration.  Critical care was time spent personally by me on the following activities: development of treatment plan with patient and/or surrogate as well as nursing, discussions with consultants, evaluation of patient's response to treatment, examination of patient, obtaining history from patient or surrogate, ordering and performing treatments and interventions, ordering and review of laboratory studies, ordering and review of radiographic studies, pulse oximetry and re-evaluation of patient's condition.  ____________________________________________   INITIAL IMPRESSION / ASSESSMENT AND PLAN / ED COURSE  Pertinent labs & imaging results that were available during my care of the patient were reviewed by me and considered in my medical decision making (see chart for details).  60 year old male with PMH as noted above including stroke, hyperlipidemia,  hypertension, alcohol abuse and GERD presents with cute onset of chest pain about an hour ago followed by left sided numbness/tingling.  The symptoms have subsided but not resolved.  I reviewed the past medical records in Epic.  The patient was most recently seen by his PMD last month and is planned for work-up for possible gastric cancer.  He was seen at [facility] with TIA involving left-sided numbness in 2019.  On exam, the patient is overall well-appearing.  His vital signs are normal except for hypertension.  Neurologic exam is normal except he has some mild motor weakness/drift in the left lower extremity.  Motor is intact in all other extremities, and he has no facial droop or ataxia.  Overall presentation is concerning for TIA versus acute stroke.  Differential includes ACS, musculoskeletal pain, radiculopathy.    Code stroke was activated at triage.  The patient immediately went to CT, which is negative.  He was then seen by Dr. Selina Cooley from neurology.  She advised that based on his exam she would cancel the code stroke and recommends admission for stroke work-up and further imaging.  ----------------------------------------- 4:42 PM on 12/31/2020 -----------------------------------------  Lab work-up is so far unremarkable.  The initial troponin is negative.  The patient states that the numbness and chest pain have all now resolved.  He has received 324 mg of aspirin.  I consulted the hospitalist  for admission.  ____________________________________________   FINAL CLINICAL IMPRESSION(S) / ED DIAGNOSES  Final diagnoses:  Transient ischemic attack  Atypical chest pain      NEW MEDICATIONS STARTED DURING THIS VISIT:  New Prescriptions   No medications on file     Note:  This document was prepared using Dragon voice recognition software and may include unintentional dictation errors.   Dionne Bucy, MD 12/31/20 [phone]

## 2020-12-31 NOTE — ED Notes (Signed)
Pt drank 3oz of water without coughing or stopping in between or after.

## 2020-12-31 NOTE — ED Triage Notes (Signed)
Pt came in pov; c/o chest pain and weakness; hx of stroke a few years ago; numbness when he came in but none now; takes a blood thinner;

## 2020-12-31 NOTE — ED Notes (Signed)
Patient back to room from CT

## 2020-12-31 NOTE — ED Notes (Signed)
Patient out of room in MRI.

## 2021-01-01 LAB — CBC WITH DIFFERENTIAL/PLATELET
Abs Immature Granulocytes: 0.03 10*3/uL (ref 0.00–0.07)
Basophils Absolute: 0.1 10*3/uL (ref 0.0–0.1)
Basophils Relative: 1 %
Eosinophils Absolute: 0.4 10*3/uL (ref 0.0–0.5)
Eosinophils Relative: 6 %
HCT: 41.7 % (ref 39.0–52.0)
Hemoglobin: 14.3 g/dL (ref 13.0–17.0)
Immature Granulocytes: 0 %
Lymphocytes Relative: 25 %
Lymphs Abs: 1.8 10*3/uL (ref 0.7–4.0)
MCH: 32 pg (ref 26.0–34.0)
MCHC: 34.3 g/dL (ref 30.0–36.0)
MCV: 93.3 fL (ref 80.0–100.0)
Monocytes Absolute: 0.7 10*3/uL (ref 0.1–1.0)
Monocytes Relative: 10 %
Neutro Abs: 4.2 10*3/uL (ref 1.7–7.7)
Neutrophils Relative %: 58 %
Platelets: 275 10*3/uL (ref 150–400)
RBC: 4.47 MIL/uL (ref 4.22–5.81)
RDW: 13 % (ref 11.5–15.5)
WBC: 7.2 10*3/uL (ref 4.0–10.5)
nRBC: 0 % (ref 0.0–0.2)

## 2021-01-01 LAB — LIPID PANEL
Cholesterol: 135 mg/dL (ref 0–200)
HDL: 50 mg/dL (ref >40)
LDL Cholesterol: 56 mg/dL (ref 0–99)
Total CHOL/HDL Ratio: 2.7 RATIO
Triglycerides: 146 mg/dL (ref <150)
VLDL: 29 mg/dL (ref 0–40)

## 2021-01-01 LAB — BASIC METABOLIC PANEL
Anion gap: 8 (ref 5–15)
BUN: 15 mg/dL (ref 6–20)
CO2: 25 mmol/L (ref 22–32)
Calcium: 8.7 mg/dL — ABNORMAL LOW (ref 8.9–10.3)
Chloride: 103 mmol/L (ref 98–111)
Creatinine, Ser: 0.85 mg/dL (ref 0.61–1.24)
GFR, Estimated: >60 mL/min (ref >60)
Glucose, Bld: 98 mg/dL (ref 70–99)
Potassium: 3.8 mmol/L (ref 3.5–5.1)
Sodium: 136 mmol/L (ref 135–145)

## 2021-01-01 LAB — ECHOCARDIOGRAM COMPLETE
Area-P 1/2: 4.06 cm2
Height: 69 in
S' Lateral: 2.68 cm
Weight: 2560 oz

## 2021-01-01 LAB — HIV ANTIBODY (ROUTINE TESTING W REFLEX): HIV Screen 4th Generation wRfx: NONREACTIVE

## 2021-01-01 NOTE — Progress Notes (Signed)
Went into room and patient sitting on edge of bed anxiously waiting to speak with nurse. Did state to him that I spoke with Dr. Rito Ehrlich and that we were waiting on an echo report and re-evaluation from neuro to D/C from facility. He did state that he didn't want to wait for them and wanted to leave now. Did tell him that he would need to sign AMA form and that insurance possibly would not cover hospital stay. He stated that was fine he just wanted to leave now. He said he wanted the IV out, sign the form and go out. I did ask if he would need a wheelchair and he refused. Stated he could walk to the door. Removed IV to left forearm. AMA paper signed and on chart.

## 2021-01-01 NOTE — Discharge Summary (Signed)
[facility]  Physician Discharge Summary   Patient ID: Tyler French MRN: [id number] DOB/AGE: [birth year] 60 y.o.  Admit date: 12/31/2020 Discharge date: 01/01/2021  PCP: Pcp, No  PATIENT LEFT AGAINST MEDICAL ADVICE    INITIAL HISTORY: Tyler French is a 60 y.o. male with medical history significant of hypertension, hyperlipidemia, GERD, insomnia, acute ischemic infarct left MCA stroke in 2017 who presents for evaluation of acute onset left-sided numbness with associated with chest pain approximately 1 hour prior to presentation.  The pain was described as sharp and substernal.  At time of arrival to the ED the pain is subsided.  Patient was driving when it began.  Symptoms started with chest pain and developed numbness and tingling to the left side of his face, left arm, left leg.  Also improved upon arrival to the ED but not entirely resolved.  Patient states that symptoms are similar to TIA few years back.  On my evaluation patient is resting comfortably in bed.  He is hemodynamically stable.  He is essentially asymptomatic at time of my examination.  Denies chest pain or shortness of breath.  Denies any persistent numbness tingling.  Denies any dizziness or vertigo.  ED Course: On arrival code stroke was called.  After neurology evaluation was completed as code stroke was canceled.  CT head was negative.  Laboratory investigation overall unrevealing.  EDP contacted hospitalist for admission for stroke work-up.  Consultations:  Neurology  Procedures: Transthoracic echocardiogram   HOSPITAL COURSE:    TIA Patient had left-sided tingling and numbness while he was driving.  Due to his history of stroke patient was hospitalized.  MRI brain did not show any acute stroke.  Neurology was consulted.  Plan was to do echocardiogram and for the patient to be reevaluated by neurology.  However patient did not want to wait for the report of the echocardiogram.  Also did not  want to wait for reevaluation by neurology.   He was adamant that he wanted to go home and subsequently left AGAINST MEDICAL ADVICE.   History of CVA Acute ischemic infarct left MCA stroke in 2017  Essential hypertension  Hyperlipidemia  Insomnia  Tobacco abuse History of alcohol abuse    PATIENT LEFT AGAINST MEDICAL ADVICE      PERTINENT LABS:  The results of significant diagnostics from this hospitalization (including imaging, microbiology, ancillary and laboratory) are listed below for reference.    Microbiology: Recent Results (from the past 240 hour(s))  Resp Panel by RT-PCR (Flu A&B, Covid) Nasopharyngeal Swab     Status: None   Collection Time: 12/31/20  4:05 PM   Specimen: Nasopharyngeal Swab; Nasopharyngeal(NP) swabs in vial transport medium  Result Value Ref Range Status   SARS Coronavirus 2 by RT PCR NEGATIVE NEGATIVE Final    Comment: (NOTE) SARS-CoV-2 target nucleic acids are NOT DETECTED.  The SARS-CoV-2 RNA is generally detectable in upper respiratory specimens during the acute phase of infection. The lowest concentration of SARS-CoV-2 viral copies this assay can detect is 138 copies/mL. A negative result does not preclude SARS-Cov-2 infection and should not be used as the sole basis for treatment or other patient management decisions. A negative result may occur with  improper specimen collection/handling, submission of specimen other than nasopharyngeal swab, presence of viral mutation(s) within the areas targeted by this assay, and inadequate number of viral copies(<138 copies/mL). A negative result must be combined with clinical observations, patient history, and epidemiological information. The expected result is Negative.  Fact Sheet for Patients:  BloggerCourse.com  Fact Sheet for Healthcare Providers:  SeriousBroker.it  This test is no t yet approved or cleared by the [place] FDA  and  has been authorized for detection and/or diagnosis of SARS-CoV-2 by FDA under an Emergency Use Authorization (EUA). This EUA will remain  in effect (meaning this test can be used) for the duration of the COVID-19 declaration under Section 564(b)(1) of the Act, 21 U.S.C.section 360bbb-3(b)(1), unless the authorization is terminated  or revoked sooner.       Influenza A by PCR NEGATIVE NEGATIVE Final   Influenza B by PCR NEGATIVE NEGATIVE Final    Comment: (NOTE) The Xpert Xpress SARS-CoV-2/FLU/RSV plus assay is intended as an aid in the diagnosis of influenza from Nasopharyngeal swab specimens and should not be used as a sole basis for treatment. Nasal washings and aspirates are unacceptable for Xpert Xpress SARS-CoV-2/FLU/RSV testing.  Fact Sheet for Patients: BloggerCourse.com  Fact Sheet for Healthcare Providers: SeriousBroker.it  This test is not yet approved or cleared by the [place] FDA and has been authorized for detection and/or diagnosis of SARS-CoV-2 by FDA under an Emergency Use Authorization (EUA). This EUA will remain in effect (meaning this test can be used) for the duration of the COVID-19 declaration under Section 564(b)(1) of the Act, 21 U.S.C. section 360bbb-3(b)(1), unless the authorization is terminated or revoked.  Performed at [facility], [address] [place], [place] [address]      Labs:  COVID-19 Labs   Lab Results  Component Value Date   SARSCOV2NAA NEGATIVE 12/31/2020      Basic Metabolic Panel: Recent Labs  Lab 12/31/20 1524 01/01/21 0502  NA 136 136  K 3.9 3.8  CL 102 103  CO2 25 25  GLUCOSE 109* 98  BUN 18 15  CREATININE 1.07 0.85  CALCIUM 9.1 8.7*   Liver Function Tests: Recent Labs  Lab 12/31/20 1524  AST 20  ALT 19  ALKPHOS 50  BILITOT 0.4  PROT 7.3  ALBUMIN 3.9   CBC: Recent Labs  Lab 12/31/20 1524 01/01/21 0502  WBC 12.2* 7.2   NEUTROABS 8.0* 4.2  HGB 14.9 14.3  HCT 43.4 41.7  MCV 91.9 93.3  PLT 310 275    CBG: Recent Labs  Lab 12/31/20 1528  GLUCAP 109*     IMAGING STUDIES MR ANGIO HEAD WO CONTRAST  Result Date: 12/31/2020 CLINICAL DATA:  Carotid artery dissection suspected. Left-sided numbness. EXAM: MRA NECK WITHOUT AND WITH CONTRAST MRA HEAD WITHOUT CONTRAST TECHNIQUE: Multiplanar and multiecho pulse sequences of the neck were obtained without and with intravenous contrast. Angiographic images of the neck were obtained using MRA technique without and with intravenous contrast; Angiographic images of the Circle of Willis were obtained using MRA technique without intravenous contrast. CONTRAST:  22mL GADAVIST GADOBUTROL 1 MMOL/ML IV SOLN COMPARISON:  MRI brain same day FINDINGS: MRA NECK FINDINGS Branching pattern of the brachiocephalic vessels from the arch is normal. Both common carotid arteries widely patent to the bifurcation regions. On the left, there is no stenosis or irregularity. On the right, there is atherosclerotic plaque at the ICA origin and proximal bulb. Minimal diameter is 2.7 mm. Compared to a more distal cervical ICA diameter of 4 mm, this indicates a 33% stenosis. 30% stenosis at both vertebral artery origins. Beyond that, the vertebral arteries are patent through the cervical region to the basilar. MRA HEAD FINDINGS Both internal carotid arteries widely patent through the skull base and siphon regions. The anterior and middle cerebral  vessels are patent without proximal stenosis, aneurysm or vascular malformation. There is artifactual signal loss at the skull base level. Both vertebral arteries are widely patent to the basilar. There is artifactual signal loss in the distal vertebral arteries due to motion. Basilar artery is normal. Posterior circulation branch vessels appear normal. IMPRESSION: No intracranial large or medium vessel occlusion. No evidence of vessel dissection in the neck. 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 Electronically Signed   By: Paulina FusiMark  Shogry M.D.   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 Left-sided numbness. EXAM: MRA NECK WITHOUT AND WITH CONTRAST MRA HEAD WITHOUT CONTRAST TECHNIQUE: Multiplanar and multiecho pulse sequences of the neck were obtained without and with intravenous contrast. Angiographic images of the neck were obtained using MRA technique without and with intravenous contrast; Angiographic images of the Circle of Willis were obtained using MRA technique without intravenous contrast. CONTRAST:  7mL GADAVIST GADOBUTROL 1 MMOL/ML IV SOLN COMPARISON:  MRI brain same day FINDINGS: MRA NECK FINDINGS Branching pattern of the brachiocephalic vessels from the arch is normal. Both common carotid arteries widely patent to the bifurcation regions. On the left, there is no stenosis or irregularity. On the right, there is atherosclerotic plaque at the ICA origin and proximal bulb. Minimal diameter is 2.7 mm. Compared to a more distal cervical ICA diameter of 4 mm, this indicates a 33% stenosis. 30% stenosis at both vertebral artery origins. Beyond that, the vertebral arteries are patent through the cervical region to the basilar. MRA HEAD FINDINGS Both internal carotid arteries widely patent through the skull base and siphon regions. The anterior and middle cerebral vessels are patent without proximal stenosis, aneurysm or vascular malformation. There is artifactual signal loss at the skull base level. Both vertebral arteries are widely patent to the basilar. There is artifactual signal loss in the distal vertebral arteries due to motion. Basilar artery is normal. Posterior circulation branch vessels appear normal. IMPRESSION: No intracranial large or  medium vessel occlusion. No evidence of vessel dissection in the neck. 30% stenosis of both vertebral artery origins with wide patency beyond that. 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 Electronically Signed   By: Paulina FusiMark  Shogry M.D.   On: 12/31/2020 19:56   MR BRAIN WO CONTRAST  Result Date: 12/31/2020 CLINICAL DATA:  TIA.  Left-sided numbness. EXAM: MRI HEAD WITHOUT CONTRAST TECHNIQUE: Multiplanar, multiecho pulse sequences of the brain and surrounding structures were obtained without intravenous contrast. COMPARISON:  Head CT earlier same day. FINDINGS: Brain: Diffusion imaging does not show any acute or subacute infarction. Mild chronic small-vessel change affects the pons. No focal cerebellar finding. Cerebral hemispheres show minimal small vessel change of the white matter. No cortical or large vessel territory infarction. No mass lesion, hemorrhage, hydrocephalus or extra-axial collection. Vascular: Major vessels at the base of the brain show flow. Skull and upper cervical spine: Negative Sinuses/Orbits: Clear/normal Other: None IMPRESSION: No acute finding. Minimal chronic appearing small vessel change of the pons and cerebral hemispheric white matter. Electronically Signed   By: Paulina FusiMark  Shogry M.D.   On: 12/31/2020 18:15   ECHOCARDIOGRAM COMPLETE  Result Date: 01/01/2021    ECHOCARDIOGRAM REPORT   Patient Name:   Tyler French Date of Exam: 12/31/2020 Medical Rec #:  [id number]           Height:       69.0 in Accession #:    [id number]          Weight:       160.0 lb Date of Birth:  [birth year]            BSA:          1.879 m Patient Age:    59 years            BP:           163/87 mmHg Patient Gender: M                   HR:           70 bpm. Exam Location:  [facility] Procedure: 2D Echo, Cardiac Doppler and Color Doppler Indications:     G45.9 TIA  History:         Patient has no prior history of Echocardiogram examinations.  Sonographer:      Sedonia Small Rodgers-Jones Referring Phys:  [id number] Tresa Moore Diagnosing Phys: Arnoldo Hooker MD IMPRESSIONS  1. Left ventricular ejection fraction, by estimation, is 55 to 60%. The left ventricle has normal function. The left ventricle has no regional wall motion abnormalities. Left ventricular diastolic parameters were normal.  2. Right ventricular systolic function is normal. The right ventricular size is normal.  3. The mitral valve is normal in structure. Trivial mitral valve regurgitation.  4. The aortic valve is normal in structure. Aortic valve regurgitation is trivial. FINDINGS  Left Ventricle: Left ventricular ejection fraction, by estimation, is 55 to 60%. The left ventricle has normal function. The left ventricle has no regional wall motion abnormalities. The left ventricular internal cavity size was normal in size. There is  no left ventricular hypertrophy. Left ventricular diastolic parameters were normal. Right Ventricle: The right ventricular size is normal. No increase in right ventricular wall thickness. Right ventricular systolic function is normal. Left Atrium: Left atrial size was normal in size. Right Atrium: Right atrial size was normal in size. Pericardium: There is no evidence of pericardial effusion. Mitral Valve: The mitral valve is normal in structure. Trivial mitral valve regurgitation. Tricuspid Valve: The tricuspid valve is normal in structure. Tricuspid valve regurgitation is trivial. Aortic Valve: The aortic valve is normal in structure. Aortic valve regurgitation is trivial. Pulmonic Valve: The pulmonic valve was normal in structure. Pulmonic valve regurgitation is not visualized. Aorta: The aortic root and ascending aorta are structurally normal, with no evidence of dilitation. IAS/Shunts: No atrial level shunt detected by color flow Doppler.  LEFT VENTRICLE PLAX 2D LVIDd:         4.05 cm  Diastology LVIDs:         2.68 cm  LV e' medial:    8.38 cm/s LV PW:         1.16 cm  LV  E/e' medial:  11.4 LV IVS:        0.86 cm  LV e' lateral:   13.10 cm/s LVOT diam:     1.90 cm  LV E/e' lateral: 7.3 LV SV:         54 LV SV Index:   29 LVOT Area:     2.84 cm  RIGHT VENTRICLE RV Basal diam:  3.67 cm RV S prime:     12.00 cm/s TAPSE (M-mode): 2.5 cm LEFT ATRIUM  Index       RIGHT ATRIUM           Index LA diam:        3.90 cm 2.08 cm/m  RA Area:     12.00 cm LA Vol (A2C):   41.9 ml 22.30 ml/m RA Volume:   30.10 ml  16.02 ml/m LA Vol (A4C):   35.6 ml 18.95 ml/m LA Biplane Vol: 40.1 ml 21.34 ml/m  AORTIC VALVE LVOT Vmax:   92.15 cm/s LVOT Vmean:  61.050 cm/s LVOT VTI:    0.190 m  AORTA Ao Root diam: 3.20 cm MITRAL VALVE MV Area (PHT): 4.06 cm    SHUNTS MV Decel Time: 187 msec    Systemic VTI:  0.19 m MV E velocity: 95.60 cm/s  Systemic Diam: 1.90 cm MV A velocity: 77.60 cm/s MV E/A ratio:  1.23 Arnoldo Hooker MD Electronically signed by Arnoldo Hooker MD Signature Date/Time: 01/01/2021/12:33:22 PM    Final    CT HEAD CODE STROKE WO CONTRAST  Result Date: 12/31/2020 CLINICAL DATA:  Code stroke. Acute neuro deficit. Left-sided numbness. EXAM: CT HEAD WITHOUT CONTRAST TECHNIQUE: Contiguous axial images were obtained from the base of the skull through the vertex without intravenous contrast. COMPARISON:  None. FINDINGS: Brain: No evidence of acute infarction, hemorrhage, hydrocephalus, extra-axial collection or mass lesion/mass effect. Small chronic infarct in the right putamen. Mild white matter hypodensity bilaterally. Vascular: Negative for hyperdense vessel Skull: Negative Sinuses/Orbits: Mild mucosal edema paranasal sinuses. Negative orbit Other: None ASPECTS ([place]berta Stroke Program Early CT Score) - Ganglionic level infarction (caudate, lentiform nuclei, internal capsule, insula, M1-M3 cortex): 7 - Supraganglionic infarction (M4-M6 cortex): 3 Total score (0-10 with 10 being normal): 10 IMPRESSION: 1. No acute abnormality 2. ASPECTS is 10 Electronically Signed   By: Marlan Palau M.D.   On: 12/31/2020 15:11    DISCHARGE EXAMINATION: Vitals:   12/31/20 1957 01/01/21 0015 01/01/21 0333 01/01/21 0824  BP: (!) 155/94 124/72 122/62 126/85  Pulse: 80 60 63 64  Resp: 16 15 16 15   Temp: 98 F (36.7 C) 97.8 F (36.6 C) 97.8 F (36.6 C) 98.4 F (36.9 C)  TempSrc:  Oral Oral Oral  SpO2: 95% 97% 96% 98%  Weight: 70.3 kg     Height: 5\' 9"  (1.753 m)      General appearance: Awake alert.  In no distress Resp: Clear to auscultation bilaterally.  Normal effort Cardio: S1-S2 is normal regular.  No S3-S4.  No rubs murmurs or bruit GI: Abdomen is soft.  Nontender nondistended.  Bowel sounds are present normal.  No masses organomegaly   PATIENT LEFT AGAINST MEDICAL ADVICE     [facility] Pager on [email or web address]  01/02/2021, 12:13 PM

## 2021-01-01 NOTE — Progress Notes (Signed)
OT Cancellation Note  Patient Details Name: Tyler French MRN: [id number] DOB: 10/18/1960   Cancelled Treatment:    Reason Eval/Treat Not Completed: OT screened, no needs identified, will sign off. Pt has fully dressed self and ambulating around room without assistance. Pt reports all symptoms have resolved. No acute OT needs at this time. OT to SIGN OFF. Thank you for this referral.   Jackquline Denmark, MS, OTR/L , CBIS ascom [phone]  01/01/21, 9:14 AM  01/01/2021, 9:13 AM

## 2021-01-02 LAB — HEMOGLOBIN A1C
Hgb A1c MFr Bld: 6.1 % — ABNORMAL HIGH (ref 4.8–5.6)
Mean Plasma Glucose: 128 mg/dL
